# Patient Record
Sex: Female | Born: 1985
Health system: Southern US, Community
[De-identification: ages and names within clinical notes are randomized; demographics above are authoritative.]

## PROBLEM LIST (undated history)

## (undated) DIAGNOSIS — B9689 Other specified bacterial agents as the cause of diseases classified elsewhere: Secondary | ICD-10-CM

## (undated) DIAGNOSIS — N760 Acute vaginitis: Secondary | ICD-10-CM

## (undated) DIAGNOSIS — I10 Essential (primary) hypertension: Secondary | ICD-10-CM

## (undated) DIAGNOSIS — N390 Urinary tract infection, site not specified: Secondary | ICD-10-CM

## (undated) HISTORY — PX: APPENDECTOMY: SHX54

## (undated) HISTORY — PX: TUBAL LIGATION: SHX77

## (undated) HISTORY — PX: BREAST CYST EXCISION: SHX579

---

## 2012-11-15 ENCOUNTER — Encounter (HOSPITAL_BASED_OUTPATIENT_CLINIC_OR_DEPARTMENT_OTHER): Payer: Self-pay | Admitting: *Deleted

## 2012-11-15 ENCOUNTER — Emergency Department (HOSPITAL_BASED_OUTPATIENT_CLINIC_OR_DEPARTMENT_OTHER): Payer: Medicaid Other

## 2012-11-15 ENCOUNTER — Emergency Department (HOSPITAL_BASED_OUTPATIENT_CLINIC_OR_DEPARTMENT_OTHER)
Admission: EM | Admit: 2012-11-15 | Discharge: 2012-11-15 | Disposition: A | Discharge status: Home / Self Care | Payer: Medicaid Other | Attending: Emergency Medicine | Admitting: Emergency Medicine

## 2012-11-15 DIAGNOSIS — J40 Bronchitis, not specified as acute or chronic: Secondary | ICD-10-CM

## 2012-11-15 DIAGNOSIS — Z79899 Other long term (current) drug therapy: Secondary | ICD-10-CM | POA: Insufficient documentation

## 2012-11-15 DIAGNOSIS — R52 Pain, unspecified: Secondary | ICD-10-CM | POA: Insufficient documentation

## 2012-11-15 DIAGNOSIS — Z791 Long term (current) use of non-steroidal anti-inflammatories (NSAID): Secondary | ICD-10-CM | POA: Insufficient documentation

## 2012-11-15 DIAGNOSIS — J3489 Other specified disorders of nose and nasal sinuses: Secondary | ICD-10-CM | POA: Insufficient documentation

## 2012-11-15 DIAGNOSIS — IMO0001 Reserved for inherently not codable concepts without codable children: Secondary | ICD-10-CM | POA: Insufficient documentation

## 2012-11-15 MED ORDER — NAPROXEN 500 MG PO TABS: 500.0000 mg | ORAL_TABLET | Freq: Two times a day (BID) | ORAL | Status: DC

## 2012-11-15 MED ORDER — MUCINEX DM 30-600 MG PO TB12: 1.0000 | ORAL_TABLET | Freq: Two times a day (BID) | ORAL | Status: DC

## 2012-11-15 MED ORDER — ALBUTEROL SULFATE HFA 108 (90 BASE) MCG/ACT IN AERS: 1.0000 | INHALATION_SPRAY | Freq: Four times a day (QID) | RESPIRATORY_TRACT | Status: DC | PRN

## 2012-11-15 MED ORDER — ALBUTEROL SULFATE HFA 108 (90 BASE) MCG/ACT IN AERS
2.0000 | INHALATION_SPRAY | RESPIRATORY_TRACT | Status: DC | PRN
  Administered 2012-11-15: 2 via RESPIRATORY_TRACT
  Filled 2012-11-15: qty 6.7

## 2012-11-15 NOTE — ED Notes (Signed)
Patient was instructed on proper MDI use with spacer. Patient demonstrated technique well and had no issues nor questions. Rt will continue to monitor.

## 2012-11-15 NOTE — ED Notes (Signed)
Patient transported to and from x-ray.

## 2012-11-15 NOTE — ED Provider Notes (Signed)
History     CSN: [id number]  Arrival date & time 11/15/12  0744   First MD Initiated Contact with Patient 11/15/12 [phone]      Chief Complaint  Patient presents with  . Cough  . Generalized Body Aches    (Consider location/radiation/quality/duration/timing/severity/associated sxs/prior treatment) Patient is a 27 y.o. female presenting with cough. The history is provided by the patient.  Cough Associated symptoms: myalgias   Associated symptoms: no chest pain, no fever, no headaches, no rash, no shortness of breath and no sore throat    one-week history of cough congestion and bodyaches. Family members had the same illness. Now predominantly with productive cough with yellow sputum and occasional blood streaking. Early in the illness has some nausea and vomiting but that has resolved. Patient denies fevers.  History reviewed. No pertinent past medical history.  History reviewed. No pertinent past surgical history.  History reviewed. No pertinent family history.  History  Substance Use Topics  . Smoking status: Never Smoker   . Smokeless tobacco: Not on file  . Alcohol Use: Yes     Comment: occ    OB History   Grav Para Term Preterm Abortions TAB SAB Ect Mult Living                  Review of Systems  Constitutional: Negative for fever.  HENT: Positive for congestion. Negative for sore throat and sinus pressure.   Eyes: Negative for redness.  Respiratory: Positive for cough. Negative for shortness of breath.   Cardiovascular: Negative for chest pain.  Gastrointestinal: Negative for nausea, vomiting and abdominal pain.  Genitourinary: Negative for dysuria.  Musculoskeletal: Positive for myalgias.  Skin: Negative for rash.  Neurological: Negative for headaches.  Hematological: Does not bruise/bleed easily.    Allergies  Review of patient's allergies indicates no known allergies.  Home Medications   Current Outpatient Rx  Name  Route  Sig  Dispense  Refill  .  albuterol (PROVENTIL HFA;VENTOLIN HFA) 108 (90 BASE) MCG/ACT inhaler   Inhalation   Inhale 1-2 puffs into the lungs every 6 (six) hours as needed for wheezing.   1 Inhaler   0   . Dextromethorphan-Guaifenesin (MUCINEX DM) 30-600 MG TB12   Oral   Take 1 tablet by mouth every 12 (twelve) hours.   14 each   0   . naproxen (NAPROSYN) 500 MG tablet   Oral   Take 1 tablet (500 mg total) by mouth 2 (two) times daily.   14 tablet   0     BP 129/92  Pulse 92  Temp(Src) 98.2 F (36.8 C) (Oral)  Resp 18  SpO2 100%  LMP 11/04/2012  Physical Exam  Nursing note and vitals reviewed. Constitutional: She is oriented to person, place, and time. She appears well-developed and well-nourished. No distress.  HENT:  Head: Normocephalic and atraumatic.  Mouth/Throat: Oropharynx is clear and moist.  Eyes: Conjunctivae and EOM are normal. Pupils are equal, round, and reactive to light.  Neck: Normal range of motion. Neck supple.  Cardiovascular: Normal rate, regular rhythm and normal heart sounds.   No murmur heard. Pulmonary/Chest: Breath sounds normal. No respiratory distress. She has no wheezes. She has no rales.  Abdominal: Soft. Bowel sounds are normal. There is no tenderness.  Musculoskeletal: Normal range of motion.  Neurological: She is alert and oriented to person, place, and time. No cranial nerve deficit. She exhibits normal muscle tone. Coordination normal.  Skin: Skin is warm. No rash noted.  ED Course  Procedures (including critical care time)  Labs Reviewed - No data to display Dg Chest 2 View  11/15/2012  *RADIOLOGY REPORT*  Clinical Data: Cough and body aches.  CHEST - 2 VIEW  Comparison: None  Findings: The cardiac silhouette, mediastinal and hilar contours are normal.  The lungs are clear.  No pleural effusion.  The bony thorax is intact.  IMPRESSION: Normal chest x-ray.   Original Report Authenticated By: Rudie Meyer, M.D.      1. Bronchitis       MDM  Patient  nontoxic no acute distress. Chest x-ray negative for pneumonia symptoms consistent with a viral illness upper respiratory illness now predominantly a viral bronchitis. We'll treat her with albuterol inhaler and Mucinex DM and Naprosyn.        Shelda Jakes, MD 11/15/12 [phone]

## 2012-11-15 NOTE — ED Notes (Signed)
Pt states she has been sick for one week with cough congestion and body aches ..daughter had same first  Then mom caught it . Productive cough with yellow sputum

## 2014-06-18 ENCOUNTER — Encounter (HOSPITAL_BASED_OUTPATIENT_CLINIC_OR_DEPARTMENT_OTHER): Payer: Self-pay | Admitting: Emergency Medicine

## 2014-06-18 ENCOUNTER — Emergency Department (HOSPITAL_BASED_OUTPATIENT_CLINIC_OR_DEPARTMENT_OTHER)
Admission: EM | Admit: 2014-06-18 | Discharge: 2014-06-18 | Disposition: A | Discharge status: Home / Self Care | Payer: Medicaid Other | Attending: Emergency Medicine | Admitting: Emergency Medicine

## 2014-06-18 DIAGNOSIS — Z79899 Other long term (current) drug therapy: Secondary | ICD-10-CM | POA: Insufficient documentation

## 2014-06-18 DIAGNOSIS — J069 Acute upper respiratory infection, unspecified: Secondary | ICD-10-CM | POA: Diagnosis not present

## 2014-06-18 DIAGNOSIS — Z791 Long term (current) use of non-steroidal anti-inflammatories (NSAID): Secondary | ICD-10-CM | POA: Insufficient documentation

## 2014-06-18 DIAGNOSIS — J029 Acute pharyngitis, unspecified: Secondary | ICD-10-CM | POA: Diagnosis present

## 2014-06-18 DIAGNOSIS — E669 Obesity, unspecified: Secondary | ICD-10-CM | POA: Diagnosis not present

## 2014-06-18 NOTE — ED Provider Notes (Signed)
CSN: [id number]     Arrival date & time 06/18/14  1250 History   First MD Initiated Contact with Patient 06/18/14 1335     Chief Complaint  Patient presents with  . Sore Throat     (Consider location/radiation/quality/duration/timing/severity/associated sxs/prior Treatment) HPI 28 y.o female with nasal congestion, sneezing, body aches after caring for daughter with similar symptoms last week.  Patient with symptoms present for two days.  She denies fever, chills, dyspnea, productive cough, nausea, vomiting or diarrhea.  STates took benadryl and mucous membranes dry.    History reviewed. No pertinent past medical history. Past Surgical History  Procedure Laterality Date  . Appendectomy    . Breast cyst excision    . Tubal ligation     No family history on file. History  Substance Use Topics  . Smoking status: Never Smoker   . Smokeless tobacco: Not on file  . Alcohol Use: Yes     Comment: occ   OB History   Grav Para Term Preterm Abortions TAB SAB Ect Mult Living                 Review of Systems  All other systems reviewed and are negative.     Allergies  Review of patient's allergies indicates no known allergies.  Home Medications   Prior to Admission medications   Medication Sig Start Date End Date Taking? Authorizing Provider  Cetirizine HCl (ZYRTEC ALLERGY PO) Take by mouth.   Yes Historical Provider, MD  DiphenhydrAMINE HCl (BENADRYL ALLERGY PO) Take by mouth.   Yes Historical Provider, MD  albuterol (PROVENTIL HFA;VENTOLIN HFA) 108 (90 BASE) MCG/ACT inhaler Inhale 1-2 puffs into the lungs every 6 (six) hours as needed for wheezing. 11/15/12   Vanetta Mulders, MD  Dextromethorphan-Guaifenesin (MUCINEX DM) 30-600 MG TB12 Take 1 tablet by mouth every 12 (twelve) hours. 11/15/12   Vanetta Mulders, MD  naproxen (NAPROSYN) 500 MG tablet Take 1 tablet (500 mg total) by mouth 2 (two) times daily. 11/15/12   Vanetta Mulders, MD   BP 137/88  Pulse 90  Temp(Src) 98.4 F  (36.9 C) (Oral)  Resp 16  Ht  (1.575 m)  Wt 230 lb (104.327 kg)  BMI 42.06 kg/m2  SpO2 100%  LMP 06/13/2014 Physical Exam  Nursing note and vitals reviewed. Constitutional: She is oriented to person, place, and time. She appears well-developed and well-nourished.  Obese  HENT:  Head: Normocephalic and atraumatic.  Right Ear: External ear normal.  Left Ear: External ear normal.  Nose: Nose normal.  Mouth/Throat: Oropharynx is clear and moist.  Eyes: Conjunctivae and EOM are normal. Pupils are equal, round, and reactive to light.  Neck: Normal range of motion. Neck supple. No JVD present. No tracheal deviation present. No thyromegaly present.  Cardiovascular: Normal rate, regular rhythm, normal heart sounds and intact distal pulses.   Pulmonary/Chest: Effort normal and breath sounds normal. No respiratory distress. She has no wheezes.  Abdominal: Soft. Bowel sounds are normal. She exhibits no mass. There is no tenderness. There is no guarding.  Musculoskeletal: Normal range of motion.  Lymphadenopathy:    She has no cervical adenopathy.  Neurological: She is alert and oriented to person, place, and time. She has normal reflexes. No cranial nerve deficit or sensory deficit. Gait normal. GCS eye subscore is 4. GCS verbal subscore is 5. GCS motor subscore is 6.  Reflex Scores:      Bicep reflexes are 2+ on the right side and 2+ on the left  side.      Patellar reflexes are 2+ on the right side and 2+ on the left side. Strength is 5/5 bilateral elbow flexor/extensors, wrist extension/flexion, intrinsic hand strength equal Bilateral hip flexion/extension 5/5, knee flexion/extension 5/5, ankle 5/5 flexion extension    Skin: Skin is warm and dry.  Psychiatric: She has a normal mood and affect. Her behavior is normal. Judgment and thought content normal.    ED Course  Procedures (including critical care time) Labs Review Labs Reviewed - No data to display  Imaging Review No  results found.   EKG Interpretation None      MDM   Final diagnoses:  URI (upper respiratory infection)     Patients symptoms are consistent with URI, likely viral etiology. Discussed that antibiotics are not indicated for viral infections. Pt will be discharged with symptomatic treatment.  Verbalizes understanding and is agreeable with plan. Pt is hemodynamically stable & in NAD prior to dc.     Hilario Quarry, MD 06/18/14 [phone]

## 2014-06-18 NOTE — Discharge Instructions (Signed)
Upper Respiratory Infection, Adult An upper respiratory infection (URI) is also sometimes known as the common cold. The upper respiratory tract includes the nose, sinuses, throat, trachea, and bronchi. Bronchi are the airways leading to the lungs. Most people improve within 1 week, but symptoms can last up to 2 weeks. A residual cough may last even longer.  CAUSES Many different viruses can infect the tissues lining the upper respiratory tract. The tissues become irritated and inflamed and often become very moist. Mucus production is also common. A cold is contagious. You can easily spread the virus to others by oral contact. This includes kissing, sharing a glass, coughing, or sneezing. Touching your mouth or nose and then touching a surface, which is then touched by another person, can also spread the virus. SYMPTOMS  Symptoms typically develop 1 to 3 days after you come in contact with a cold virus. Symptoms vary from person to person. They may include:  Runny nose.  Sneezing.  Nasal congestion.  Sinus irritation.  Sore throat.  Loss of voice (laryngitis).  Cough.  Fatigue.  Muscle aches.  Loss of appetite.  Headache.  Low-grade fever. DIAGNOSIS  You might diagnose your own cold based on familiar symptoms, since most people get a cold 2 to 3 times a year. Your caregiver can confirm this based on your exam. Most importantly, your caregiver can check that your symptoms are not due to another disease such as strep throat, sinusitis, pneumonia, asthma, or epiglottitis. Blood tests, throat tests, and X-rays are not necessary to diagnose a common cold, but they may sometimes be helpful in excluding other more serious diseases. Your caregiver will decide if any further tests are required. RISKS AND COMPLICATIONS  You may be at risk for a more severe case of the common cold if you smoke cigarettes, have chronic heart disease (such as heart failure) or lung disease (such as asthma), or if  you have a weakened immune system. The very young and very old are also at risk for more serious infections. Bacterial sinusitis, middle ear infections, and bacterial pneumonia can complicate the common cold. The common cold can worsen asthma and chronic obstructive pulmonary disease (COPD). Sometimes, these complications can require emergency medical care and may be life-threatening. PREVENTION  The best way to protect against getting a cold is to practice good hygiene. Avoid oral or hand contact with people with cold symptoms. Wash your hands often if contact occurs. There is no clear evidence that vitamin C, vitamin E, echinacea, or exercise reduces the chance of developing a cold. However, it is always recommended to get plenty of rest and practice good nutrition. TREATMENT  Treatment is directed at relieving symptoms. There is no cure. Antibiotics are not effective, because the infection is caused by a virus, not by bacteria. Treatment may include:  Increased fluid intake. Sports drinks offer valuable electrolytes, sugars, and fluids.  Breathing heated mist or steam (vaporizer or shower).  Eating chicken soup or other clear broths, and maintaining good nutrition.  Getting plenty of rest.  Using gargles or lozenges for comfort.  Controlling fevers with ibuprofen or acetaminophen as directed by your caregiver.  Increasing usage of your inhaler if you have asthma. Zinc gel and zinc lozenges, taken in the first 24 hours of the common cold, can shorten the duration and lessen the severity of symptoms. Pain medicines may help with fever, muscle aches, and throat pain. A variety of non-prescription medicines are available to treat congestion and runny nose. Your caregiver   can make recommendations and may suggest nasal or lung inhalers for other symptoms.  HOME CARE INSTRUCTIONS   Only take over-the-counter or prescription medicines for pain, discomfort, or fever as directed by your  caregiver.  Use a warm mist humidifier or inhale steam from a shower to increase air moisture. This may keep secretions moist and make it easier to breathe.  Drink enough water and fluids to keep your urine clear or pale yellow.  Rest as needed.  Return to work when your temperature has returned to normal or as your caregiver advises. You may need to stay home longer to avoid infecting others. You can also use a face mask and careful hand washing to prevent spread of the virus. SEEK MEDICAL CARE IF:   After the first few days, you feel you are getting worse rather than better.  You need your caregiver's advice about medicines to control symptoms.  You develop chills, worsening shortness of breath, or brown or red sputum. These may be signs of pneumonia.  You develop yellow or brown nasal discharge or pain in the face, especially when you bend forward. These may be signs of sinusitis.  You develop a fever, swollen neck glands, pain with swallowing, or white areas in the back of your throat. These may be signs of strep throat. SEEK IMMEDIATE MEDICAL CARE IF:   You have a fever.  You develop severe or persistent headache, ear pain, sinus pain, or chest pain.  You develop wheezing, a prolonged cough, cough up blood, or have a change in your usual mucus (if you have chronic lung disease).  You develop sore muscles or a stiff neck. Document Released: 03/10/2001 Document Revised: 12/07/2011 Document Reviewed: 12/20/2013 [employer] Patient Information 2015 [employer]. This information is not intended to replace advice given to you by your health care provider. Make sure you discuss any questions you have with your health care provider.  

## 2014-06-18 NOTE — ED Notes (Signed)
C/o sore throat, cough, generalized pain x 2 days

## 2015-12-22 ENCOUNTER — Encounter (HOSPITAL_BASED_OUTPATIENT_CLINIC_OR_DEPARTMENT_OTHER): Payer: Self-pay | Admitting: Emergency Medicine

## 2015-12-22 ENCOUNTER — Emergency Department (HOSPITAL_BASED_OUTPATIENT_CLINIC_OR_DEPARTMENT_OTHER)
Admission: EM | Admit: 2015-12-22 | Discharge: 2015-12-22 | Disposition: A | Discharge status: Home / Self Care | Payer: No Typology Code available for payment source | Attending: Emergency Medicine | Admitting: Emergency Medicine

## 2015-12-22 DIAGNOSIS — M545 Low back pain, unspecified: Secondary | ICD-10-CM

## 2015-12-22 DIAGNOSIS — S299XXA Unspecified injury of thorax, initial encounter: Secondary | ICD-10-CM | POA: Insufficient documentation

## 2015-12-22 DIAGNOSIS — Z79899 Other long term (current) drug therapy: Secondary | ICD-10-CM | POA: Insufficient documentation

## 2015-12-22 DIAGNOSIS — Y9389 Activity, other specified: Secondary | ICD-10-CM | POA: Insufficient documentation

## 2015-12-22 DIAGNOSIS — Z791 Long term (current) use of non-steroidal anti-inflammatories (NSAID): Secondary | ICD-10-CM | POA: Insufficient documentation

## 2015-12-22 DIAGNOSIS — Y998 Other external cause status: Secondary | ICD-10-CM | POA: Diagnosis not present

## 2015-12-22 DIAGNOSIS — Y92410 Unspecified street and highway as the place of occurrence of the external cause: Secondary | ICD-10-CM | POA: Insufficient documentation

## 2015-12-22 DIAGNOSIS — S3992XA Unspecified injury of lower back, initial encounter: Secondary | ICD-10-CM | POA: Diagnosis not present

## 2015-12-22 MED ORDER — CYCLOBENZAPRINE HCL 10 MG PO TABS: 10.0000 mg | ORAL_TABLET | Freq: Two times a day (BID) | ORAL | Status: DC | PRN

## 2015-12-22 NOTE — ED Notes (Signed)
Pt c/o neck pain and radiating back pain,
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
## 2015-12-22 NOTE — Discharge Instructions (Signed)
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
## 2015-12-28 ENCOUNTER — Emergency Department (HOSPITAL_BASED_OUTPATIENT_CLINIC_OR_DEPARTMENT_OTHER)
Admission: EM | Admit: 2015-12-28 | Discharge: 2015-12-28 | Disposition: A | Discharge status: Home / Self Care | Payer: Medicaid Other | Attending: Emergency Medicine | Admitting: Emergency Medicine

## 2015-12-28 ENCOUNTER — Encounter (HOSPITAL_BASED_OUTPATIENT_CLINIC_OR_DEPARTMENT_OTHER): Payer: Self-pay | Admitting: *Deleted

## 2015-12-28 DIAGNOSIS — Z791 Long term (current) use of non-steroidal anti-inflammatories (NSAID): Secondary | ICD-10-CM | POA: Diagnosis not present

## 2015-12-28 DIAGNOSIS — Z202 Contact with and (suspected) exposure to infections with a predominantly sexual mode of transmission: Secondary | ICD-10-CM | POA: Diagnosis not present

## 2015-12-28 DIAGNOSIS — Z79899 Other long term (current) drug therapy: Secondary | ICD-10-CM | POA: Diagnosis not present

## 2015-12-28 DIAGNOSIS — Z3202 Encounter for pregnancy test, result negative: Secondary | ICD-10-CM | POA: Diagnosis not present

## 2015-12-28 LAB — WET PREP, GENITAL
CLUE CELLS WET PREP: NONE SEEN
Sperm: NONE SEEN
TRICH WET PREP: NONE SEEN
YEAST WET PREP: NONE SEEN

## 2015-12-28 LAB — PREGNANCY, URINE: Preg Test, Ur: NEGATIVE

## 2015-12-28 MED ORDER — CEFTRIAXONE SODIUM 250 MG IJ SOLR
250.0000 mg | Freq: Once | INTRAMUSCULAR | Status: AC
  Administered 2015-12-28: 250 mg via INTRAMUSCULAR
  Filled 2015-12-28: qty 250

## 2015-12-28 MED ORDER — LIDOCAINE HCL (PF) 1 % IJ SOLN
INTRAMUSCULAR | Status: AC
  Administered 2015-12-28: 2.1 mL
  Filled 2015-12-28: qty 5

## 2015-12-28 MED ORDER — AZITHROMYCIN 250 MG PO TABS
1000.0000 mg | ORAL_TABLET | Freq: Once | ORAL | Status: AC
  Administered 2015-12-28: 1000 mg via ORAL
  Filled 2015-12-28: qty 4
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
## 2015-12-30 LAB — GC/CHLAMYDIA PROBE AMP (~~LOC~~) NOT AT ARMC
Chlamydia: NEGATIVE
Neisseria Gonorrhea: NEGATIVE

## 2016-02-04 ENCOUNTER — Encounter (HOSPITAL_BASED_OUTPATIENT_CLINIC_OR_DEPARTMENT_OTHER): Payer: Self-pay | Admitting: *Deleted

## 2016-02-04 ENCOUNTER — Emergency Department (HOSPITAL_BASED_OUTPATIENT_CLINIC_OR_DEPARTMENT_OTHER): Payer: Medicaid Other

## 2016-02-04 ENCOUNTER — Emergency Department (HOSPITAL_BASED_OUTPATIENT_CLINIC_OR_DEPARTMENT_OTHER)
Admission: EM | Admit: 2016-02-04 | Discharge: 2016-02-04 | Disposition: A | Discharge status: Home / Self Care | Payer: Medicaid Other | Attending: Emergency Medicine | Admitting: Emergency Medicine

## 2016-02-04 DIAGNOSIS — I10 Essential (primary) hypertension: Secondary | ICD-10-CM | POA: Insufficient documentation

## 2016-02-04 DIAGNOSIS — J329 Chronic sinusitis, unspecified: Secondary | ICD-10-CM | POA: Insufficient documentation

## 2016-02-04 DIAGNOSIS — R0602 Shortness of breath: Secondary | ICD-10-CM | POA: Diagnosis present

## 2016-02-04 DIAGNOSIS — J3489 Other specified disorders of nose and nasal sinuses: Secondary | ICD-10-CM

## 2016-02-04 DIAGNOSIS — M542 Cervicalgia: Secondary | ICD-10-CM | POA: Insufficient documentation

## 2016-02-04 DIAGNOSIS — R0981 Nasal congestion: Secondary | ICD-10-CM

## 2016-02-04 HISTORY — DX: Essential (primary) hypertension: I10

## 2016-02-04 MED ORDER — ALBUTEROL SULFATE HFA 108 (90 BASE) MCG/ACT IN AERS
1.0000 | INHALATION_SPRAY | Freq: Once | RESPIRATORY_TRACT | Status: AC
  Administered 2016-02-04: 2 via RESPIRATORY_TRACT
  Filled 2016-02-04: qty 6.7

## 2016-02-04 MED ORDER — MUCINEX DM 30-600 MG PO TB12: 1.0000 | ORAL_TABLET | Freq: Two times a day (BID) | ORAL | Status: DC

## 2016-02-04 MED ORDER — IBUPROFEN 800 MG PO TABS
800.0000 mg | ORAL_TABLET | Freq: Once | ORAL | Status: AC
  Administered 2016-02-04: 800 mg via ORAL
  Filled 2016-02-04: qty 1

## 2016-02-04 MED ORDER — CETIRIZINE-PSEUDOEPHEDRINE ER 5-120 MG PO TB12: 1.0000 | ORAL_TABLET | Freq: Two times a day (BID) | ORAL | Status: DC

## 2016-02-04 MED FILL — SM ALL DAY ALLERGY-D TABLET: 5-120 | 15 days supply | Qty: 30 | Fill #0

## 2016-02-04 NOTE — ED Notes (Signed)
PA at bedside.

## 2016-02-04 NOTE — Discharge Instructions (Signed)
Medications: Zyrtec-D, Albuterol, Mucinex DM  Treatment: Take Zyrtec-D twice daily as prescribed. Use albuterol inhaler as needed for shortness of breath. Take Mucinex DM twice daily for cough, if you develop one. You may take ibuprofen or Tylenol every 4-6 hours as needed for your neck soreness, headaches, and facial pressure.  Follow-up: Please follow-up with your primary care provider if your symptoms have not improved in 7-10 days. Please return to the emergency department if he develop any worsening shortness of breath, chest pain, or any other concerning symptoms.   Upper Respiratory Infection, Adult Most upper respiratory infections (URIs) are a viral infection of the air passages leading to the lungs. A URI affects the nose, throat, and upper air passages. The most common type of URI is nasopharyngitis and is typically referred to as "the common cold." URIs run their course and usually go away on their own. Most of the time, a URI does not require medical attention, but sometimes a bacterial infection in the upper airways can follow a viral infection. This is called a secondary infection. Sinus and middle ear infections are common types of secondary upper respiratory infections. Bacterial pneumonia can also complicate a URI. A URI can worsen asthma and chronic obstructive pulmonary disease (COPD). Sometimes, these complications can require emergency medical care and may be life threatening.  CAUSES Almost all URIs are caused by viruses. A virus is a type of germ and can spread from one person to another.  RISKS FACTORS You may be at risk for a URI if:   You smoke.   You have chronic heart or lung disease.  You have a weakened defense (immune) system.   You are very young or very old.   You have nasal allergies or asthma.  You work in crowded or poorly ventilated areas.  You work in health care facilities or schools. SIGNS AND SYMPTOMS  Symptoms typically develop 2-3 days  after you come in contact with a cold virus. Most viral URIs last 7-10 days. However, viral URIs from the influenza virus (flu virus) can last 14-18 days and are typically more severe. Symptoms may include:   Runny or stuffy (congested) nose.   Sneezing.   Cough.   Sore throat.   Headache.   Fatigue.   Fever.   Loss of appetite.   Pain in your forehead, behind your eyes, and over your cheekbones (sinus pain).  Muscle aches.  DIAGNOSIS  Your health care provider may diagnose a URI by:  Physical exam.  Tests to check that your symptoms are not due to another condition such as:  Strep throat.  Sinusitis.  Pneumonia.  Asthma. TREATMENT  A URI goes away on its own with time. It cannot be cured with medicines, but medicines may be prescribed or recommended to relieve symptoms. Medicines may help:  Reduce your fever.  Reduce your cough.  Relieve nasal congestion. HOME CARE INSTRUCTIONS   Take medicines only as directed by your health care provider.   Gargle warm saltwater or take cough drops to comfort your throat as directed by your health care provider.  Use a warm mist humidifier or inhale steam from a shower to increase air moisture. This may make it easier to breathe.  Drink enough fluid to keep your urine clear or pale yellow.   Eat soups and other clear broths and maintain good nutrition.   Rest as needed.   Return to work when your temperature has returned to normal or as your health care provider  advises. You may need to stay home longer to avoid infecting others. You can also use a face mask and careful hand washing to prevent spread of the virus.  Increase the usage of your inhaler if you have asthma.   Do not use any tobacco products, including cigarettes, chewing tobacco, or electronic cigarettes. If you need help quitting, ask your health care provider. PREVENTION  The best way to protect yourself from getting a cold is to practice  good hygiene.   Avoid oral or hand contact with people with cold symptoms.   Wash your hands often if contact occurs.  There is no clear evidence that vitamin C, vitamin E, echinacea, or exercise reduces the chance of developing a cold. However, it is always recommended to get plenty of rest, exercise, and practice good nutrition.  SEEK MEDICAL CARE IF:   You are getting worse rather than better.   Your symptoms are not controlled by medicine.   You have chills.  You have worsening shortness of breath.  You have brown or red mucus.  You have yellow or brown nasal discharge.  You have pain in your face, especially when you bend forward.  You have a fever.  You have swollen neck glands.  You have pain while swallowing.  You have white areas in the back of your throat. SEEK IMMEDIATE MEDICAL CARE IF:   You have severe or persistent:  Headache.  Ear pain.  Sinus pain.  Chest pain.  You have chronic lung disease and any of the following:  Wheezing.  Prolonged cough.  Coughing up blood.  A change in your usual mucus.  You have a stiff neck.  You have changes in your:  Vision.  Hearing.  Thinking.  Mood. MAKE SURE YOU:   Understand these instructions.  Will watch your condition.  Will get help right away if you are not doing well or get worse.   This information is not intended to replace advice given to you by your health care provider. Make sure you discuss any questions you have with your health care provider.   Document Released: 03/10/2001 Document Revised: 01/29/2015 Document Reviewed: 12/20/2013 Elsevier Interactive Patient Education 2016 [employer]  Musculoskeletal Pain Musculoskeletal pain is muscle and boney aches and pains. These pains can occur in any part of the body. Your caregiver may treat you without knowing the cause of the pain. They may treat you if blood or urine tests, X-rays, and other tests were normal.   CAUSES There is often not a definite cause or reason for these pains. These pains may be caused by a type of germ (virus). The discomfort may also come from overuse. Overuse includes working out too hard when your body is not fit. Boney aches also come from weather changes. Bone is sensitive to atmospheric pressure changes. HOME CARE INSTRUCTIONS   Ask when your test results will be ready. Make sure you get your test results.  Only take over-the-counter or prescription medicines for pain, discomfort, or fever as directed by your caregiver. If you were given medications for your condition, do not drive, operate machinery or power tools, or sign legal documents for 24 hours. Do not drink alcohol. Do not take sleeping pills or other medications that may interfere with treatment.  Continue all activities unless the activities cause more pain. When the pain lessens, slowly resume normal activities. Gradually increase the intensity and duration of the activities or exercise.  During periods of severe pain, bed rest may  be helpful. Lay or sit in any position that is comfortable.  Putting ice on the injured area.  Put ice in a bag.  Place a towel between your skin and the bag.  Leave the ice on for 15 to 20 minutes, 3 to 4 times a day.  Follow up with your caregiver for continued problems and no reason can be found for the pain. If the pain becomes worse or does not go away, it may be necessary to repeat tests or do additional testing. Your caregiver may need to look further for a possible cause. SEEK IMMEDIATE MEDICAL CARE IF:  You have pain that is getting worse and is not relieved by medications.  You develop chest pain that is associated with shortness or breath, sweating, feeling sick to your stomach (nauseous), or throw up (vomit).  Your pain becomes localized to the abdomen.  You develop any new symptoms that seem different or that concern you. MAKE SURE YOU:   Understand these  instructions.  Will watch your condition.  Will get help right away if you are not doing well or get worse.   This information is not intended to replace advice given to you by your health care provider. Make sure you discuss any questions you have with your health care provider.   Document Released: 09/14/2005 Document Revised: 12/07/2011 Document Reviewed: 05/19/2013 Elsevier Interactive Patient Education [employer]

## 2016-02-04 NOTE — ED Notes (Signed)
C/o congestion, left ear pain, some dizziness, facial pressure, pain in back of neck since Sunday. States b/p was elevated on Friday but she has been taking her medicine now and it has improved. 160/88 in triage

## 2016-02-04 NOTE — ED Notes (Signed)
Patient transported to X-ray 
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
## 2017-01-30 ENCOUNTER — Emergency Department (HOSPITAL_BASED_OUTPATIENT_CLINIC_OR_DEPARTMENT_OTHER)
Admission: EM | Admit: 2017-01-30 | Discharge: 2017-01-30 | Disposition: A | Discharge status: Home / Self Care | Payer: Self-pay | Attending: Emergency Medicine | Admitting: Emergency Medicine

## 2017-01-30 ENCOUNTER — Encounter (HOSPITAL_BASED_OUTPATIENT_CLINIC_OR_DEPARTMENT_OTHER): Payer: Self-pay | Admitting: *Deleted

## 2017-01-30 ENCOUNTER — Emergency Department (HOSPITAL_BASED_OUTPATIENT_CLINIC_OR_DEPARTMENT_OTHER): Payer: Self-pay

## 2017-01-30 DIAGNOSIS — J302 Other seasonal allergic rhinitis: Secondary | ICD-10-CM | POA: Insufficient documentation

## 2017-01-30 DIAGNOSIS — I10 Essential (primary) hypertension: Secondary | ICD-10-CM | POA: Insufficient documentation

## 2017-01-30 DIAGNOSIS — Z79899 Other long term (current) drug therapy: Secondary | ICD-10-CM | POA: Insufficient documentation

## 2017-01-30 DIAGNOSIS — R51 Headache: Secondary | ICD-10-CM

## 2017-01-30 DIAGNOSIS — R519 Headache, unspecified: Secondary | ICD-10-CM

## 2017-01-30 DIAGNOSIS — R0789 Other chest pain: Secondary | ICD-10-CM | POA: Insufficient documentation

## 2017-01-30 DIAGNOSIS — R0602 Shortness of breath: Secondary | ICD-10-CM | POA: Insufficient documentation

## 2017-01-30 MED ORDER — LORATADINE 10 MG PO TABS: 10.0000 mg | ORAL_TABLET | Freq: Every day | ORAL | 0 refills | Status: DC

## 2017-01-30 MED ORDER — OXYMETAZOLINE HCL 0.05 % NA SOLN
2.0000 | Freq: Once | NASAL | Status: AC
  Administered 2017-01-30: 2 via NASAL
  Filled 2017-01-30: qty 15

## 2017-01-30 MED ORDER — KETOROLAC TROMETHAMINE 60 MG/2ML IM SOLN
60.0000 mg | Freq: Once | INTRAMUSCULAR | Status: AC
  Administered 2017-01-30: 60 mg via INTRAMUSCULAR
  Filled 2017-01-30: qty 2

## 2017-01-30 MED ORDER — FLUTICASONE PROPIONATE 50 MCG/ACT NA SUSP: 2.0000 | Freq: Every day | NASAL | 0 refills | Status: DC

## 2017-01-30 MED ORDER — IBUPROFEN 600 MG PO TABS: 600.0000 mg | ORAL_TABLET | Freq: Four times a day (QID) | ORAL | 0 refills | Status: DC | PRN

## 2017-01-30 NOTE — ED Triage Notes (Addendum)
Pt reports waking up today around 0500 with a headache to L temple. Also reports feeling anxious and sob (reports it's improved since then -- pt able to talk in complete sentences). Denies fever, n/v/d, hx of migraines. States she usually gets headaches when her BP is high (reports running out of HTN med 3 days ago).

## 2017-01-30 NOTE — ED Notes (Signed)
ED Provider at bedside. 

## 2017-01-30 NOTE — ED Notes (Signed)
Patient transported to X-ray 

## 2017-01-30 NOTE — ED Provider Notes (Signed)
[facility] Provider Note   CSN: [id number] Arrival date & time: 01/30/17  0702     History   Chief Complaint Chief Complaint  Patient presents with  . Headache    HPI Renee Moore is a 31 y.o. female.  HPI Patient presents with left facial headache that was present this morning when waking. Admits to nasal congestion. Denies photophobia, nausea or vomiting. No fever or chills. Denies any known injury. Patient has also had some chest tightness associated with shortness of breath since resolved. She has history of allergies and thinks she may be getting a sinus infection. Past Medical History:  Diagnosis Date  . Hypertension     There are no active problems to display for this patient.   Past Surgical History:  Procedure Laterality Date  . APPENDECTOMY    . BREAST CYST EXCISION    . TUBAL LIGATION      OB History    No data available       Home Medications    Prior to Admission medications   Medication Sig Start Date End Date Taking? Authorizing Provider  UNKNOWN TO PATIENT    Yes [provider]  albuterol (PROVENTIL HFA;VENTOLIN HFA) 108 (90 BASE) MCG/ACT inhaler Inhale 1-2 puffs into the lungs every 6 (six) hours as needed for wheezing. 11/15/12   Vanetta Mulders, MD  Cetirizine HCl (ZYRTEC ALLERGY PO) Take by mouth.    [provider]  cetirizine-pseudoephedrine (ZYRTEC-D) 5-120 MG tablet Take 1 tablet by mouth 2 (two) times daily. 02/04/16   Law, Waylan Boga, PA-C  cyclobenzaprine (FLEXERIL) 10 MG tablet Take 1 tablet (10 mg total) by mouth 2 (two) times daily as needed for muscle spasms. 12/22/15   Hedges, Tinnie Gens, PA-C  Dextromethorphan-Guaifenesin (MUCINEX DM) 30-600 MG TB12 Take 1 tablet by mouth every 12 (twelve) hours. 02/04/16   Law, Waylan Boga, PA-C  DiphenhydrAMINE HCl (BENADRYL ALLERGY PO) Take by mouth.    [provider]  fluticasone (FLONASE) 50 MCG/ACT nasal spray Place 2 sprays into both nostrils daily. 01/30/17    Loren Racer, MD  ibuprofen (ADVIL,MOTRIN) 600 MG tablet Take 1 tablet (600 mg total) by mouth every 6 (six) hours as needed. 01/30/17   Loren Racer, MD  loratadine (CLARITIN) 10 MG tablet Take 1 tablet (10 mg total) by mouth daily. 01/30/17   Loren Racer, MD  naproxen (NAPROSYN) 500 MG tablet Take 1 tablet (500 mg total) by mouth 2 (two) times daily. 11/15/12   Vanetta Mulders, MD    Family History No family history on file.  Social History Social History  Substance Use Topics  . Smoking status: Never Smoker  . Smokeless tobacco: Never Used  . Alcohol use Yes     Comment: weekends     Allergies   Patient has no known allergies.   Review of Systems Review of Systems  Constitutional: Negative for chills and fever.  HENT: Positive for congestion, sinus pain and sinus pressure. Negative for sore throat.   Eyes: Negative for photophobia and visual disturbance.  Respiratory: Positive for cough, chest tightness and shortness of breath.   Cardiovascular: Negative for chest pain.  Gastrointestinal: Negative for abdominal pain, constipation, diarrhea, nausea and vomiting.  Genitourinary: Negative for dysuria, flank pain and frequency.  Musculoskeletal: Positive for myalgias and neck pain. Negative for back pain and neck stiffness.  Neurological: Positive for headaches. Negative for dizziness, weakness, light-headedness and numbness.  All other systems reviewed and are negative.    Physical Exam  Updated Vital Signs BP 110/70 (BP Location: Right Arm)   Pulse 83   Temp 98 F (36.7 C) (Oral)   Resp 18   Ht 5\' 3"  (1.6 m)   Wt 300 lb (136.1 kg)   LMP 01/09/2017 (Approximate)   SpO2 100%   BMI 53.14 kg/m   Physical Exam  Constitutional: She is oriented to person, place, and time. She appears well-developed and well-nourished. No distress.  HENT:  Head: Normocephalic and atraumatic.  Mouth/Throat: Oropharynx is clear and moist.  Bilateral nasal mucosal edema.  Patient has left maxillary sinus tenderness to percussion.  Eyes: EOM are normal. Pupils are equal, round, and reactive to light.  Neck: Normal range of motion. Neck supple.  No meningismus. Patient does have muscle spasm and tenderness of the left trapezius.  Cardiovascular: Normal rate and regular rhythm.  Exam reveals no gallop and no friction rub.   No murmur heard. Pulmonary/Chest: Effort normal. No respiratory distress. She has no wheezes. She has no rales. She exhibits no tenderness.  Mildly prolonged expiratory phase. No definite wheezing.  Abdominal: Soft. Bowel sounds are normal. There is no tenderness. There is no rebound and no guarding.  Musculoskeletal: Normal range of motion. She exhibits no edema or tenderness.  No lower extremity swelling, asymmetry or tenderness.  Lymphadenopathy:    She has no cervical adenopathy.  Neurological: She is alert and oriented to person, place, and time.  5/5 motor in all extremities. Sensation fully intact. Patient is ambulating without difficulty.  Skin: Skin is warm and dry. Capillary refill takes less than 2 seconds. No rash noted. No erythema.  Psychiatric: She has a normal mood and affect. Her behavior is normal.  Nursing note and vitals reviewed.    ED Treatments / Results  Labs (all labs ordered are listed, but only abnormal results are displayed) Labs Reviewed - No data to display  EKG  EKG Interpretation None       Radiology Dg Chest 2 View  Result Date: 01/30/2017 CLINICAL DATA:  Chest pain. EXAM: CHEST  2 VIEW COMPARISON:  Radiographs of Feb 04, 2016. FINDINGS: The heart size and mediastinal contours are within normal limits. Both lungs are clear. No pneumothorax or pleural effusion is noted. The visualized skeletal structures are unremarkable. IMPRESSION: No active cardiopulmonary disease. Electronically Signed   By: Lupita Raider, M.D.   On: 01/30/2017 08:27    Procedures Procedures (including critical care  time)  Medications Ordered in ED Medications  ketorolac (TORADOL) injection 60 mg (60 mg Intramuscular Given 01/30/17 0751)  oxymetazoline (AFRIN) 0.05 % nasal spray 2 spray (2 sprays Each Nare Given 01/30/17 0751)     Initial Impression / Assessment and Plan / ED Course  I have reviewed the triage vital signs and the nursing notes.  Pertinent labs & imaging results that were available during my care of the patient were reviewed by me and considered in my medical decision making (see chart for details).     Patient's symptoms are likely allergy related. No red flag signs or symptoms regarding her left facial pain. We'll treat for left maxillary sinusitis. Patient's chest tightness and shortness of breath also is likely related to allergies. We'll screen with EKG and chest x-ray. Low suspicion for coronary artery disease or PE. Patient states she is feeling much better after medication. Chest x-ray and EKG are normal. [place] Again likely symptoms are allergy related. Return precautions given. Final Clinical  Impressions(s) / ED Diagnoses   Final diagnoses:  Sinus headache  Seasonal allergic rhinitis, unspecified trigger    New Prescriptions New Prescriptions   FLUTICASONE (FLONASE) 50 MCG/ACT NASAL SPRAY    Place 2 sprays into both nostrils daily.   IBUPROFEN (ADVIL,MOTRIN) 600 MG TABLET    Take 1 tablet (600 mg total) by mouth every 6 (six) hours as needed.   LORATADINE (CLARITIN) 10 MG TABLET    Take 1 tablet (10 mg total) by mouth daily.     Loren RacerYelverton, Quavis Klutz, MD 01/30/17 [phone]

## 2017-03-18 ENCOUNTER — Emergency Department (HOSPITAL_BASED_OUTPATIENT_CLINIC_OR_DEPARTMENT_OTHER)
Admission: EM | Admit: 2017-03-18 | Discharge: 2017-03-19 | Disposition: A | Discharge status: Home / Self Care | Payer: Medicaid - Out of State | Attending: Emergency Medicine | Admitting: Emergency Medicine

## 2017-03-18 ENCOUNTER — Encounter (HOSPITAL_BASED_OUTPATIENT_CLINIC_OR_DEPARTMENT_OTHER): Payer: Self-pay

## 2017-03-18 DIAGNOSIS — B9689 Other specified bacterial agents as the cause of diseases classified elsewhere: Secondary | ICD-10-CM

## 2017-03-18 DIAGNOSIS — R1031 Right lower quadrant pain: Secondary | ICD-10-CM

## 2017-03-18 DIAGNOSIS — N760 Acute vaginitis: Secondary | ICD-10-CM | POA: Diagnosis not present

## 2017-03-18 DIAGNOSIS — I10 Essential (primary) hypertension: Secondary | ICD-10-CM | POA: Diagnosis not present

## 2017-03-18 DIAGNOSIS — A599 Trichomoniasis, unspecified: Secondary | ICD-10-CM | POA: Diagnosis not present

## 2017-03-18 DIAGNOSIS — R102 Pelvic and perineal pain: Secondary | ICD-10-CM

## 2017-03-18 LAB — URINALYSIS, MICROSCOPIC (REFLEX)

## 2017-03-18 LAB — COMPREHENSIVE METABOLIC PANEL
ALT: 21 U/L (ref 14–54)
AST: 23 U/L (ref 15–41)
Albumin: 3.5 g/dL (ref 3.5–5.0)
Alkaline Phosphatase: 62 U/L (ref 38–126)
Anion gap: 7 (ref 5–15)
BILIRUBIN TOTAL: 0.5 mg/dL (ref 0.3–1.2)
BUN: 11 mg/dL (ref 6–20)
CALCIUM: 8.6 mg/dL — AB (ref 8.9–10.3)
CO2: 27 mmol/L (ref 22–32)
CREATININE: 0.71 mg/dL (ref 0.44–1.00)
Chloride: 103 mmol/L (ref 101–111)
Glucose, Bld: 106 mg/dL — ABNORMAL HIGH (ref 65–99)
Potassium: 3.4 mmol/L — ABNORMAL LOW (ref 3.5–5.1)
Sodium: 137 mmol/L (ref 135–145)
TOTAL PROTEIN: 7.5 g/dL (ref 6.5–8.1)

## 2017-03-18 LAB — CBC WITH DIFFERENTIAL/PLATELET
BASOS ABS: 0 10*3/uL (ref 0.0–0.1)
BASOS PCT: 0 %
EOS ABS: 0.1 10*3/uL (ref 0.0–0.7)
EOS PCT: 2 %
HCT: 37.3 % (ref 36.0–46.0)
HEMOGLOBIN: 12.7 g/dL (ref 12.0–15.0)
LYMPHS ABS: 2 10*3/uL (ref 0.7–4.0)
Lymphocytes Relative: 31 %
MCH: 29.5 pg (ref 26.0–34.0)
MCHC: 34 g/dL (ref 30.0–36.0)
MCV: 86.5 fL (ref 78.0–100.0)
Monocytes Absolute: 0.6 10*3/uL (ref 0.1–1.0)
Monocytes Relative: 9 %
NEUTROS PCT: 58 %
Neutro Abs: 3.7 10*3/uL (ref 1.7–7.7)
PLATELETS: 297 10*3/uL (ref 150–400)
RBC: 4.31 MIL/uL (ref 3.87–5.11)
RDW: 15.1 % (ref 11.5–15.5)
WBC: 6.4 10*3/uL (ref 4.0–10.5)

## 2017-03-18 LAB — URINALYSIS, ROUTINE W REFLEX MICROSCOPIC
Bilirubin Urine: NEGATIVE
Glucose, UA: NEGATIVE mg/dL
KETONES UR: NEGATIVE mg/dL
NITRITE: NEGATIVE
PH: 6 (ref 5.0–8.0)
Protein, ur: NEGATIVE mg/dL
Specific Gravity, Urine: 1.025 (ref 1.005–1.030)

## 2017-03-18 LAB — WET PREP, GENITAL
Sperm: NONE SEEN
YEAST WET PREP: NONE SEEN

## 2017-03-18 LAB — PREGNANCY, URINE: Preg Test, Ur: NEGATIVE

## 2017-03-18 LAB — LIPASE, BLOOD: LIPASE: 24 U/L (ref 11–51)

## 2017-03-18 IMAGING — DX DG CHEST 2V
2 series · 2 of 2 positions shown · non-contrast
Comparison: November 15, 2012

CLINICAL DATA: Shortness of breath for 2 days

EXAM:
CHEST  2 VIEW

[chest pa]
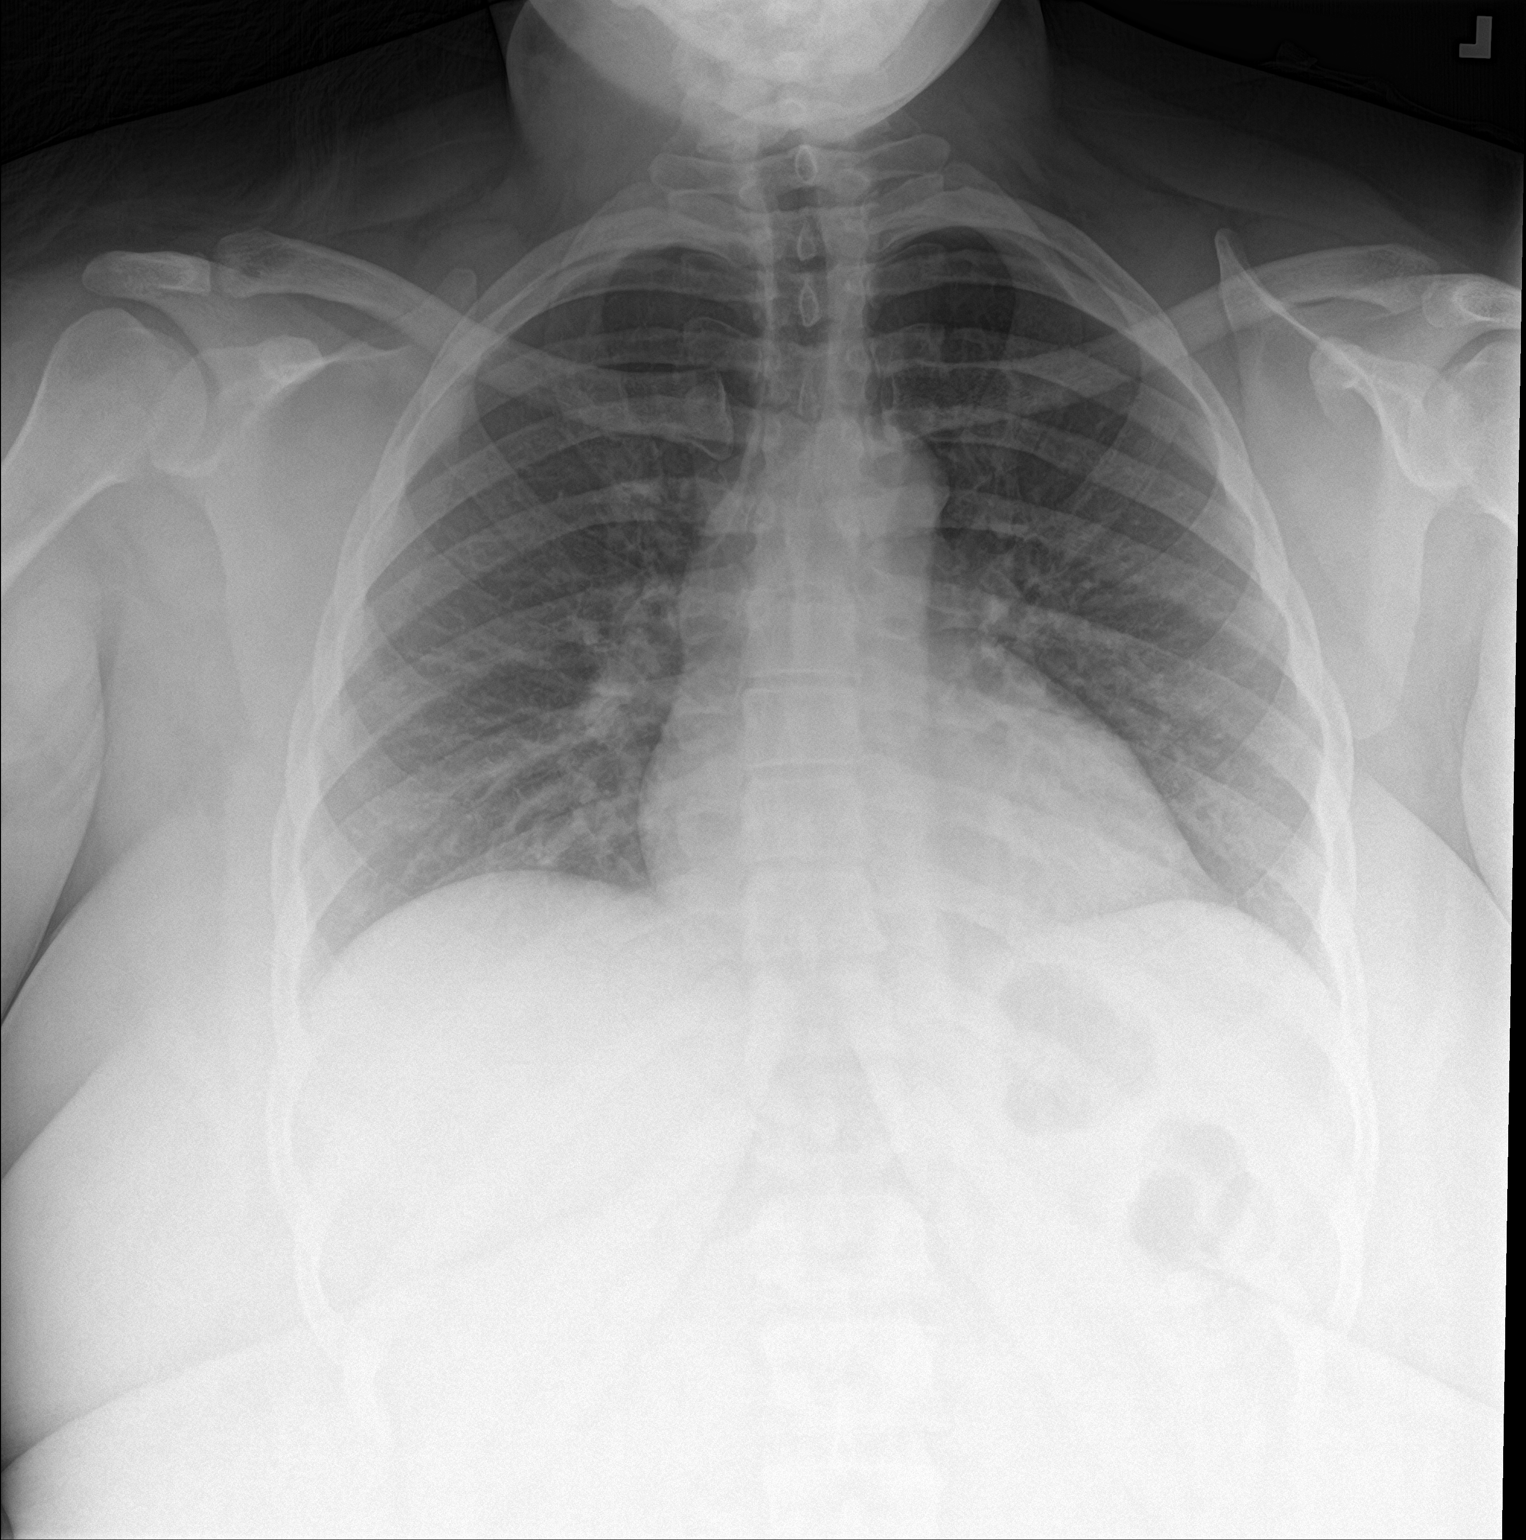

[chest lat]
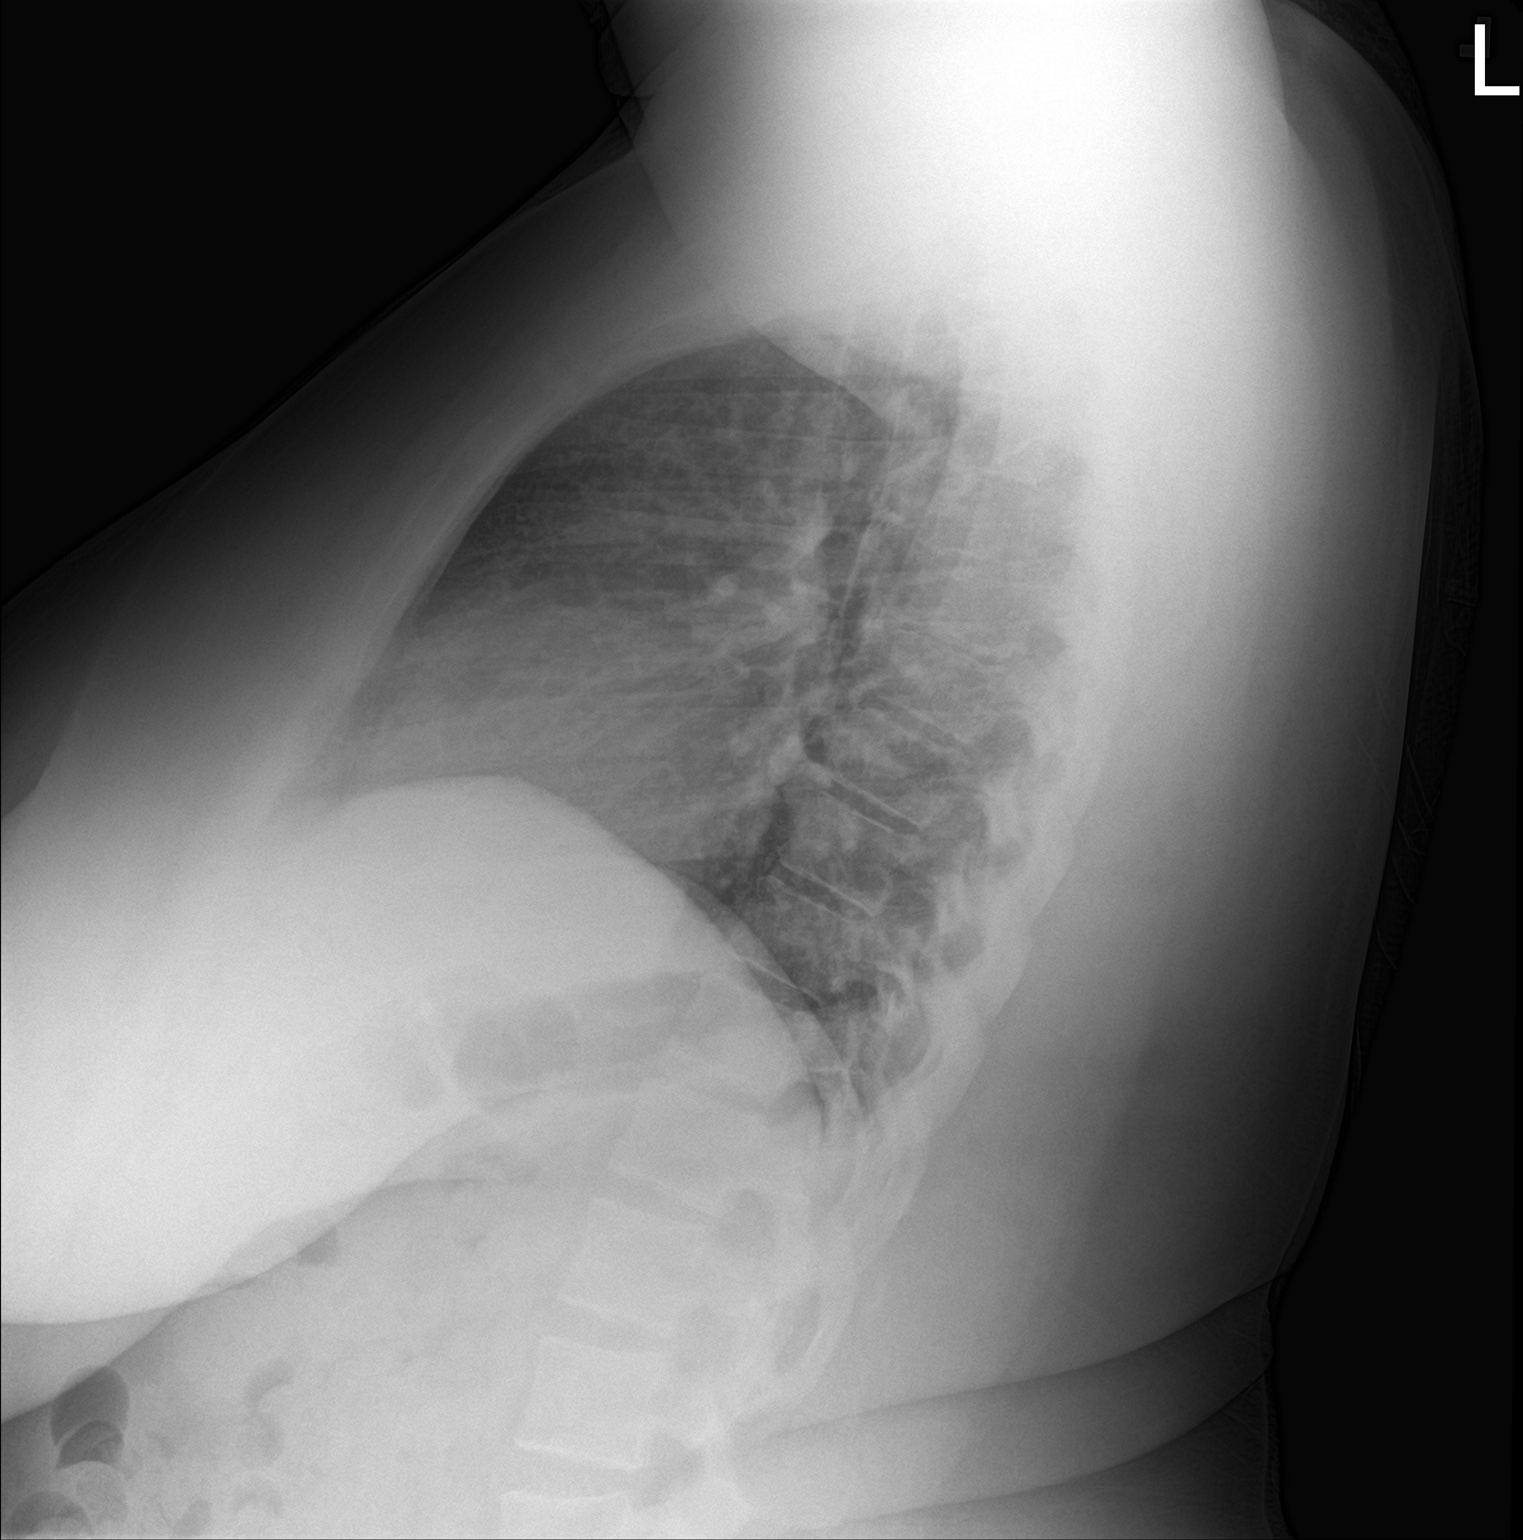

[2 of 2 positions shown; findings below may reference images not displayed]

FINDINGS: There is no edema or consolidation. The heart size and pulmonary
vascularity are normal. No adenopathy. No bone lesions. There is
minimal upper thoracic levoscoliosis.
IMPRESSION: No edema or consolidation.

## 2017-03-18 MED ORDER — METRONIDAZOLE 500 MG PO TABS
2000.0000 mg | ORAL_TABLET | Freq: Once | ORAL | Status: AC
  Administered 2017-03-18: 2000 mg via ORAL
  Filled 2017-03-18: qty 4

## 2017-03-18 MED ORDER — GI COCKTAIL ~~LOC~~
30.0000 mL | Freq: Once | ORAL | Status: AC
  Administered 2017-03-18: 30 mL via ORAL
  Filled 2017-03-18: qty 30

## 2017-03-18 MED ORDER — CEFTRIAXONE SODIUM 250 MG IJ SOLR
250.0000 mg | Freq: Once | INTRAMUSCULAR | Status: AC
  Administered 2017-03-18: 250 mg via INTRAMUSCULAR
  Filled 2017-03-18: qty 250

## 2017-03-18 MED ORDER — AZITHROMYCIN 250 MG PO TABS
1000.0000 mg | ORAL_TABLET | Freq: Once | ORAL | Status: AC
  Administered 2017-03-18: 1000 mg via ORAL
  Filled 2017-03-18: qty 4

## 2017-03-18 MED ORDER — METRONIDAZOLE 500 MG PO TABS: 500.0000 mg | ORAL_TABLET | Freq: Two times a day (BID) | ORAL | 0 refills | Status: DC

## 2017-03-18 NOTE — Discharge Instructions (Signed)
Take Flagyl twice daily for 7 days. Follow-up with PCP for further evaluation. Return to ED if severe abdominal pain, increased discharge, abnormal bleeding, trouble breathing, lightheadedness, consciousness.

## 2017-03-18 NOTE — ED Triage Notes (Signed)
C/o RLQ pain, vaginal d/c, fatigue x 2 days-NAD-steady gait

## 2017-03-18 NOTE — ED Provider Notes (Signed)
[facility]   CSN: [id number] Arrival date & time: 03/18/17  2122  By signing my name below, I, Sonum Patel, attest that this documentation has been prepared under the direction and in the presence of Kerrigan Gombos, PA-C. Electronically Signed: Leone PayorSonum Patel, Scribe. 03/18/17. 10:36 PM.  History   Chief Complaint Chief Complaint  Patient presents with  . Abdominal Pain    The history is provided by the patient. No language interpreter was used.     HPI Comments: Renee Moore is a 31 y.o. female who presents to the Emergency Department complaining of constant, unchanged RLQ abdominal pain with associated fatigue and generalized myalgias for the past 2 days. She notes vaginal discharge, Dysuria and heart burn symptoms that began today. She denies taking any OTC medications for her symptoms. She denies diarrhea, constipation, dysuria, hematuria, vaginal bleeding, cold symptoms, sore throat,  CP, SOB, cough, fever, leg swelling, hemoptysis. She has a history of an appendectomy and BTL.   Past Medical History:  Diagnosis Date  . Hypertension     There are no active problems to display for this patient.   Past Surgical History:  Procedure Laterality Date  . APPENDECTOMY    . BREAST CYST EXCISION    . TUBAL LIGATION      OB History    No data available       Home Medications    Prior to Admission medications   Medication Sig Start Date End Date Taking? Authorizing Provider  metroNIDAZOLE (FLAGYL) 500 MG tablet Take 1 tablet (500 mg total) by mouth 2 (two) times daily. 03/18/17   Dietrich PatesKhatri, Polly Barner, PA-C    Family History No family history on file.  Social History Social History  Substance Use Topics  . Smoking status: Never Smoker  . Smokeless tobacco: Never Used  . Alcohol use Yes     Comment: weekends     Allergies   Patient has no known allergies.   Review of Systems Review of Systems  Constitutional: Positive for fatigue. Negative for  fever.  Respiratory: Negative for cough and shortness of breath.   Cardiovascular: Negative for chest pain and leg swelling.  Gastrointestinal: Positive for abdominal pain. Negative for constipation.  Genitourinary: Positive for vaginal discharge. Negative for dysuria and hematuria.  Musculoskeletal: Positive for arthralgias and myalgias.  All other systems reviewed and are negative.    Physical Exam Updated Vital Signs BP 122/80 (BP Location: Right Wrist)   Pulse 96   Temp 98.9 F (37.2 C) (Oral)   Resp 18   Ht 5\' 3"  (1.6 m)   Wt (!) 144.2 kg (318 lb)   LMP 03/12/2017   SpO2 98%   BMI 56.33 kg/m   Physical Exam  Constitutional: She appears well-developed and well-nourished. No distress.  HENT:  Head: Normocephalic and atraumatic.  Nose: Nose normal.  Eyes: Conjunctivae and EOM are normal. Left eye exhibits no discharge. No scleral icterus.  Neck: Normal range of motion. Neck supple.  Cardiovascular: Normal rate, regular rhythm, normal heart sounds and intact distal pulses.  Exam reveals no gallop and no friction rub.   No murmur heard. Pulmonary/Chest: Effort normal and breath sounds normal. No respiratory distress. She has no wheezes. She has no rales.  Abdominal: Soft. Bowel sounds are normal. She exhibits no distension. There is tenderness (RLQ). There is no rebound and no guarding.  Well healed surgical scar at McBurney's point.   Genitourinary: There is no lesion on the right labia. There  is no lesion on the left labia. Cervix exhibits no motion tenderness and no friability. Right adnexum displays no mass and no tenderness. Left adnexum displays no mass and no tenderness. Vaginal discharge found.  Genitourinary Comments: There is white discharge present in vaginal vault. No bleeding noted. No cervical motion tenderness. No adnexal tenderness on either side.  Musculoskeletal: Normal range of motion. She exhibits no edema.  Neurological: She is alert. She exhibits normal  muscle tone. Coordination normal.  Skin: Skin is warm and dry. No rash noted.  Psychiatric: She has a normal mood and affect.  Nursing note and vitals reviewed.    ED Treatments / Results  DIAGNOSTIC STUDIES: Oxygen Saturation is 98% on RA, normal by my interpretation.    COORDINATION OF CARE: 10:36 PM Discussed treatment plan with pt at bedside and pt agreed to plan.   Labs (all labs ordered are listed, but only abnormal results are displayed) Labs Reviewed  WET PREP, GENITAL - Abnormal; Notable for the following:       Result Value   Trich, Wet Prep PRESENT (*)    Clue Cells Wet Prep HPF POC PRESENT (*)    WBC, Wet Prep HPF POC MODERATE (*)    All other components within normal limits  URINALYSIS, ROUTINE W REFLEX MICROSCOPIC - Abnormal; Notable for the following:    APPearance CLOUDY (*)    Hgb urine dipstick TRACE (*)    Leukocytes, UA LARGE (*)    All other components within normal limits  URINALYSIS, MICROSCOPIC (REFLEX) - Abnormal; Notable for the following:    Bacteria, UA MANY (*)    Squamous Epithelial / LPF TOO NUMEROUS TO COUNT (*)    All other components within normal limits  COMPREHENSIVE METABOLIC PANEL - Abnormal; Notable for the following:    Potassium 3.4 (*)    Glucose, Bld 106 (*)    Calcium 8.6 (*)    All other components within normal limits  URINE CULTURE  PREGNANCY, URINE  CBC WITH DIFFERENTIAL/PLATELET  LIPASE, BLOOD  HIV ANTIBODY (ROUTINE TESTING)  RPR  GC/CHLAMYDIA PROBE AMP ([facility]) NOT AT [facility]    EKG  EKG Interpretation None       Radiology No results found.  Procedures Procedures (including critical care time)  Medications Ordered in ED Medications  gi cocktail (Maalox,Lidocaine,Donnatal) (30 mLs Oral Given 03/18/17 2356)  cefTRIAXone (ROCEPHIN) injection 250 mg (250 mg Intramuscular Given 03/18/17 2356)  azithromycin (ZITHROMAX) tablet 1,000 mg (1,000 mg Oral Given 03/18/17 2356)  metroNIDAZOLE (FLAGYL) tablet 2,000 mg  (2,000 mg Oral Given 03/18/17 2354)     Initial Impression / Assessment and Plan / ED Course  I have reviewed the triage vital signs and the nursing notes.  Pertinent labs & imaging results that were available during my care of the patient were reviewed by me and considered in my medical decision making (see chart for details).     Patient presents to ED for right lower quadrant abdominal pain, fatigue, heartburn and vaginal discharge. She reports history of appendectomy and bilateral tubal ligation several years ago. She denies any fevers and is afebrile at this time. No nausea, vomiting, chest pain or shortness of breath noted. On physical exam she is tender to palpation in the right lower quadrant but no rebound or guarding present. Low suspicion for acute intra-abdominal infectious process such as colitis or cholecystitis. No need for imaging indicated at this time. Pelvic exam revealed white discharge present in vaginal vault. CBC, CMP and lipase  unremarkable. UA showed possible evidence of UTI but many squamous cells. This was sent for culture. Wet prep showed Trichomonas, clue cells and WBC present. GC chlamydia, HIV and RPR pending at this time. Patient states that she would like to be empirically treated for GC chlamydia. We will treat for Trichomonas with one time dose of Flagyl as well. She reports symptomatic improvement of her heartburn with GI cocktail here. We'll discharge patient home with treatment for BV and advise her to follow-up with her PCP for further evaluation. I advised her to follow up with the results of her remaining blood work when available. She appears stable for discharge at this time. Strict return precautions given.  Final Clinical Impressions(s) / ED Diagnoses   Final diagnoses:  Trichomoniasis  Right lower quadrant abdominal pain  Pelvic pain in female  Bacterial vaginosis    New Prescriptions Discharge Medication List as of 03/18/2017 11:44 PM    START  taking these medications   Details  metroNIDAZOLE (FLAGYL) 500 MG tablet Take 1 tablet (500 mg total) by mouth 2 (two) times daily., Starting Thu 03/18/2017, Print       I personally performed the services described in this documentation, which was scribed in my presence. The recorded information has been reviewed and is accurate.    Dietrich Pates, PA-C 03/19/17 4540    Marily Memos, MD 03/20/17 1148

## 2017-03-19 MED FILL — metroNIDAZOLE 500 MG TABS: 500 | 7 days supply | Qty: 14 | Fill #0

## 2017-03-19 NOTE — ED Notes (Signed)
Pt verbalizes understanding of d/c instructions and denies any further needs at this time. 

## 2017-03-20 LAB — URINE CULTURE

## 2017-03-20 LAB — RPR: RPR Ser Ql: NONREACTIVE

## 2017-03-20 LAB — HIV ANTIBODY (ROUTINE TESTING W REFLEX): HIV SCREEN 4TH GENERATION: NONREACTIVE

## 2017-03-22 LAB — GC/CHLAMYDIA PROBE AMP (~~LOC~~) NOT AT ARMC
Chlamydia: NEGATIVE
NEISSERIA GONORRHEA: NEGATIVE

## 2017-04-27 ENCOUNTER — Emergency Department (HOSPITAL_BASED_OUTPATIENT_CLINIC_OR_DEPARTMENT_OTHER)
Admission: EM | Admit: 2017-04-27 | Discharge: 2017-04-28 | Disposition: A | Discharge status: Home / Self Care | Payer: Medicaid - Out of State | Attending: Emergency Medicine | Admitting: Emergency Medicine

## 2017-04-27 ENCOUNTER — Encounter (HOSPITAL_BASED_OUTPATIENT_CLINIC_OR_DEPARTMENT_OTHER): Payer: Self-pay

## 2017-04-27 DIAGNOSIS — R1030 Lower abdominal pain, unspecified: Secondary | ICD-10-CM

## 2017-04-27 DIAGNOSIS — M62838 Other muscle spasm: Secondary | ICD-10-CM | POA: Diagnosis not present

## 2017-04-27 DIAGNOSIS — B9689 Other specified bacterial agents as the cause of diseases classified elsewhere: Secondary | ICD-10-CM

## 2017-04-27 DIAGNOSIS — R350 Frequency of micturition: Secondary | ICD-10-CM | POA: Diagnosis not present

## 2017-04-27 DIAGNOSIS — N760 Acute vaginitis: Secondary | ICD-10-CM | POA: Insufficient documentation

## 2017-04-27 DIAGNOSIS — R1032 Left lower quadrant pain: Secondary | ICD-10-CM | POA: Insufficient documentation

## 2017-04-27 DIAGNOSIS — M542 Cervicalgia: Secondary | ICD-10-CM | POA: Diagnosis present

## 2017-04-27 DIAGNOSIS — I10 Essential (primary) hypertension: Secondary | ICD-10-CM | POA: Insufficient documentation

## 2017-04-27 NOTE — ED Triage Notes (Signed)
Pt c/o pain all the way down her left side for the last several days, no injury, is under a lot of stress as she has had several family members pass away

## 2017-04-28 LAB — URINALYSIS, ROUTINE W REFLEX MICROSCOPIC
BILIRUBIN URINE: NEGATIVE
Glucose, UA: NEGATIVE mg/dL
Hgb urine dipstick: NEGATIVE
KETONES UR: NEGATIVE mg/dL
NITRITE: NEGATIVE
PROTEIN: NEGATIVE mg/dL
SPECIFIC GRAVITY, URINE: 1.013 (ref 1.005–1.030)
pH: 7 (ref 5.0–8.0)

## 2017-04-28 LAB — URINALYSIS, MICROSCOPIC (REFLEX)

## 2017-04-28 LAB — PREGNANCY, URINE: PREG TEST UR: NEGATIVE

## 2017-04-28 LAB — WET PREP, GENITAL
Sperm: NONE SEEN
Trich, Wet Prep: NONE SEEN
YEAST WET PREP: NONE SEEN

## 2017-04-28 MED ORDER — METRONIDAZOLE 500 MG PO TABS: 500.0000 mg | ORAL_TABLET | Freq: Two times a day (BID) | ORAL | 0 refills | Status: DC

## 2017-04-28 MED ORDER — METHOCARBAMOL 500 MG PO TABS: 500.0000 mg | ORAL_TABLET | Freq: Three times a day (TID) | ORAL | 0 refills | Status: AC | PRN

## 2017-04-28 MED ORDER — IBUPROFEN 800 MG PO TABS: 800.0000 mg | ORAL_TABLET | Freq: Three times a day (TID) | ORAL | 0 refills | Status: AC | PRN

## 2017-04-28 MED ORDER — IBUPROFEN 800 MG PO TABS
800.0000 mg | ORAL_TABLET | Freq: Once | ORAL | Status: AC
  Administered 2017-04-28: 800 mg via ORAL
  Filled 2017-04-28: qty 1

## 2017-04-28 NOTE — ED Provider Notes (Signed)
TIME SEEN: 12:09 AM  CHIEF COMPLAINT: Left neck pain, vaginal discharge and left lower abdominal pain  HPI: Patient is a 31 year old female with history of hypertension who presents to the emergency department with multiple complaints. She complains of several days of left neck pain that is worse with palpation and movement of the left shoulder. No injury to the neck or arm. Described as a aching, throbbing pain. Has tried Tylenol without much relief. No numbness, tingling or focal weakness. No bowel or bladder incontinence. No urinary retention.   Patient also complains of several days of left lower quadrant abdominal pain she describes as sharp in nature with associated vaginal discharge and odor. Has had urinary frequency. Was just here in the emergency department for similar symptoms and had right lower quadrant pain. She is status post appendectomy and BTL. States that she was diagnosed with Trichomonas. She was empirically covered for gonorrhea, Chlamydia and Trichomonas and discharge with prescription of metronidazole for bacterial vaginosis. States she lost this prescription and she never completed these antibiotics. She denies any fevers, chills, nausea, vomiting, diarrhea. No dysuria or hematuria but has again had some urinary frequency. Has had Trichomonas twice before. No other STDs.  ROS: See HPI Constitutional: no fever  Eyes: no drainage  ENT: no runny nose   Cardiovascular:  no chest pain  Resp: no SOB  GI: no vomiting GU: no dysuria Integumentary: no rash  Allergy: no hives  Musculoskeletal: no leg swelling  Neurological: no slurred speech ROS otherwise negative  PAST MEDICAL HISTORY/PAST SURGICAL HISTORY:  Past Medical History:  Diagnosis Date  . Hypertension     MEDICATIONS:  Prior to Admission medications   Medication Sig Start Date End Date Taking? Authorizing Provider  metroNIDAZOLE (FLAGYL) 500 MG tablet Take 1 tablet (500 mg total) by mouth 2 (two) times daily.  03/18/17   Dietrich PatesKhatri, Hina, PA-C    ALLERGIES:  No Known Allergies  SOCIAL HISTORY:  Social History  Substance Use Topics  . Smoking status: Never Smoker  . Smokeless tobacco: Never Used  . Alcohol use Yes     Comment: weekends    FAMILY HISTORY: No family history on file.  EXAM: BP 125/89 (BP Location: Right Arm)   Pulse 86   Temp 98.8 F (37.1 C) (Oral)   Resp 18   Ht 5\' 4"  (1.626 m)   Wt (!) 140.6 kg (310 lb)   LMP 04/06/2017   SpO2 100%   BMI 53.21 kg/m  CONSTITUTIONAL: Alert and oriented and responds appropriately to questions. Well-appearing; well-nourished, Obese, afebrile, nontoxic HEAD: Normocephalic EYES: Conjunctivae clear, pupils appear equal, EOMI ENT: normal nose; moist mucous membranes NECK: Supple, no meningismus, no nuchal rigidity, no LAD; tender to palpation with associated muscle spasm over the left trapezius muscle there is no erythema, warmth or swelling. There is no midline spinal tenderness or step-off or deformity. CARD: RRR; S1 and S2 appreciated; no murmurs, no clicks, no rubs, no gallops RESP: Normal chest excursion without splinting or tachypnea; breath sounds clear and equal bilaterally; no wheezes, no rhonchi, no rales, no hypoxia or respiratory distress, speaking full sentences ABD/GI: Normal bowel sounds; non-distended; soft, non-tender, no rebound, no guarding, no peritoneal signs, no hepatosplenomegaly GU:  Normal external genitalia. No lesions, rashes noted. Patient has no vaginal bleeding on exam. Moderate amount of white foul-smelling vaginal discharge.  No adnexal tenderness, mass or fullness, no cervical motion tenderness. Cervix is not appear friable.  Cervix is closed.  Chaperone present for exam. BACK:  The back appears normal and is non-tender to palpation, there is no CVA tenderness EXT: Normal ROM in all joints; non-tender to palpation; no edema; normal capillary refill; no cyanosis, no calf tenderness or swelling; no bony tenderness  or bony deformity over the left shoulder, humerus, elbow, forearm, wrist or hand. 2+ radial pulses bilaterally. Left extremity is warm and well-perfused without soft tissue swelling. Compartments are soft. There is no erythema or warmth noted to the left upper extremity. She has full range of motion in all joints. SKIN: Normal color for age and race; warm; no rash NEURO: Moves all extremities equally, normal gait, sensation to light touch intact diffusely, no clonus, no saddle anesthesia PSYCH: The patient's mood and manner are appropriate. Grooming and personal hygiene are appropriate.  MEDICAL DECISION MAKING: Patient here with complaints of left-sided neck pain. She appears have a trapezius muscle spasm. Recommended ibuprofen, muscle relaxers. She did drive herself to the emergency department and has her young daughter here with her. Discussed with her if she would like to drive him we cannot give her any medications that may make her drowsy prior to leaving. She is comfortable with this plan. No midline neck tenderness or neurologic deficits. Doubt spinal stenosis, epidural abscess or hematoma, cauda equina, discitis, transverse myelitis. Doubt fracture of the neck. She does report that ibuprofen which we provided her in the emergency department has helped with her pain.   Patient also complaining of left-sided pelvic pain or vaginal discharge. Abdominal exam is benign. She has no adnexal tenderness, fullness or cervical motion tenderness. Recently tested for STDs which were all negative except for her Trichomonas and she was given empiric treatment for this. Her wet prep today shows no Trichomonas she does have clue cells. We'll discharge with another prescription of metronidazole for bacterial vaginosis. Her urine shows small leukocytes and many bacteria but also many squamous cells. I do not think that this is a urinary tract infection. She reports she does have a OB/GYN for follow-up.  Doubt PID,  TOA, torsion, diverticulitis, colitis, bowel obstruction based on her benign exam.   At this time, I do not feel there is any life-threatening condition present. I have reviewed and discussed all results (EKG, imaging, lab, urine as appropriate) and exam findings with patient/family. I have reviewed nursing notes and appropriate previous records.  I feel the patient is safe to be discharged home without further emergent workup and can continue workup as an outpatient as needed. Discussed usual and customary return precautions. Patient/family verbalize understanding and are comfortable with this plan.  Outpatient follow-up has been provided if needed. All questions have been answered.      Derinda Bartus, Layla MawKristen N, DO 04/28/17 [phone]

## 2017-04-30 LAB — GC/CHLAMYDIA PROBE AMP (~~LOC~~) NOT AT ARMC
CHLAMYDIA, DNA PROBE: NEGATIVE
Neisseria Gonorrhea: NEGATIVE

## 2019-04-04 ENCOUNTER — Encounter (HOSPITAL_BASED_OUTPATIENT_CLINIC_OR_DEPARTMENT_OTHER): Payer: Self-pay

## 2019-04-04 ENCOUNTER — Emergency Department (HOSPITAL_BASED_OUTPATIENT_CLINIC_OR_DEPARTMENT_OTHER)
Admission: EM | Admit: 2019-04-04 | Discharge: 2019-04-04 | Disposition: A | Discharge status: Home / Self Care | Payer: Medicaid - Out of State | Attending: Emergency Medicine | Admitting: Emergency Medicine

## 2019-04-04 ENCOUNTER — Other Ambulatory Visit: Payer: Self-pay

## 2019-04-04 DIAGNOSIS — R1013 Epigastric pain: Secondary | ICD-10-CM | POA: Insufficient documentation

## 2019-04-04 DIAGNOSIS — R1030 Lower abdominal pain, unspecified: Secondary | ICD-10-CM | POA: Insufficient documentation

## 2019-04-04 DIAGNOSIS — I10 Essential (primary) hypertension: Secondary | ICD-10-CM | POA: Insufficient documentation

## 2019-04-04 DIAGNOSIS — R102 Pelvic and perineal pain: Secondary | ICD-10-CM | POA: Insufficient documentation

## 2019-04-04 DIAGNOSIS — B9689 Other specified bacterial agents as the cause of diseases classified elsewhere: Secondary | ICD-10-CM

## 2019-04-04 DIAGNOSIS — N3001 Acute cystitis with hematuria: Secondary | ICD-10-CM | POA: Insufficient documentation

## 2019-04-04 DIAGNOSIS — N760 Acute vaginitis: Secondary | ICD-10-CM | POA: Insufficient documentation

## 2019-04-04 LAB — COMPREHENSIVE METABOLIC PANEL
ALT: 15 U/L (ref 0–44)
AST: 16 U/L (ref 15–41)
Albumin: 3.7 g/dL (ref 3.5–5.0)
Alkaline Phosphatase: 62 U/L (ref 38–126)
Anion gap: 12 (ref 5–15)
BUN: 14 mg/dL (ref 6–20)
CO2: 25 mmol/L (ref 22–32)
Calcium: 8.9 mg/dL (ref 8.9–10.3)
Chloride: 99 mmol/L (ref 98–111)
Creatinine, Ser: 0.65 mg/dL (ref 0.44–1.00)
GFR calc Af Amer: >60 mL/min (ref >60)
GFR calc non Af Amer: >60 mL/min (ref >60)
Glucose, Bld: 92 mg/dL (ref 70–99)
Potassium: 3.5 mmol/L (ref 3.5–5.1)
Sodium: 136 mmol/L (ref 135–145)
Total Bilirubin: 0.8 mg/dL (ref 0.3–1.2)
Total Protein: 7.9 g/dL (ref 6.5–8.1)

## 2019-04-04 LAB — URINALYSIS, ROUTINE W REFLEX MICROSCOPIC
Bilirubin Urine: NEGATIVE
Glucose, UA: NEGATIVE mg/dL
Ketones, ur: NEGATIVE mg/dL
Nitrite: NEGATIVE
Protein, ur: 100 mg/dL — AB
Specific Gravity, Urine: >1.03 — ABNORMAL HIGH (ref 1.005–1.030)
pH: 6 (ref 5.0–8.0)

## 2019-04-04 LAB — CBC WITH DIFFERENTIAL/PLATELET
Abs Immature Granulocytes: 0.02 10*3/uL (ref 0.00–0.07)
Basophils Absolute: 0 10*3/uL (ref 0.0–0.1)
Basophils Relative: 0 %
Eosinophils Absolute: 0.1 10*3/uL (ref 0.0–0.5)
Eosinophils Relative: 1 %
HCT: 39.1 % (ref 36.0–46.0)
Hemoglobin: 12.6 g/dL (ref 12.0–15.0)
Immature Granulocytes: 0 %
Lymphocytes Relative: 26 %
Lymphs Abs: 2.1 10*3/uL (ref 0.7–4.0)
MCH: 29.2 pg (ref 26.0–34.0)
MCHC: 32.2 g/dL (ref 30.0–36.0)
MCV: 90.5 fL (ref 80.0–100.0)
Monocytes Absolute: 0.5 10*3/uL (ref 0.1–1.0)
Monocytes Relative: 6 %
Neutro Abs: 5.5 10*3/uL (ref 1.7–7.7)
Neutrophils Relative %: 67 %
Platelets: 317 10*3/uL (ref 150–400)
RBC: 4.32 MIL/uL (ref 3.87–5.11)
RDW: 14.7 % (ref 11.5–15.5)
WBC: 8.2 10*3/uL (ref 4.0–10.5)
nRBC: 0 % (ref 0.0–0.2)

## 2019-04-04 LAB — WET PREP, GENITAL
Sperm: NONE SEEN
Trich, Wet Prep: NONE SEEN
Yeast Wet Prep HPF POC: NONE SEEN

## 2019-04-04 LAB — LIPASE, BLOOD: Lipase: 24 U/L (ref 11–51)

## 2019-04-04 LAB — URINALYSIS, MICROSCOPIC (REFLEX): RBC / HPF: >50 RBC/hpf (ref 0–5)

## 2019-04-04 LAB — PREGNANCY, URINE: Preg Test, Ur: NEGATIVE

## 2019-04-04 MED ORDER — CEPHALEXIN 500 MG PO CAPS: 500.0000 mg | ORAL_CAPSULE | Freq: Three times a day (TID) | ORAL | 0 refills | Status: AC

## 2019-04-04 MED ORDER — ONDANSETRON HCL 4 MG/2ML IJ SOLN
4.0000 mg | Freq: Once | INTRAMUSCULAR | Status: AC
  Administered 2019-04-04: 4 mg via INTRAVENOUS
  Filled 2019-04-04: qty 2

## 2019-04-04 MED ORDER — ACETAMINOPHEN 500 MG PO TABS
1000.0000 mg | ORAL_TABLET | Freq: Once | ORAL | Status: AC
  Administered 2019-04-04: 21:00:00 1000 mg via ORAL
  Filled 2019-04-04: qty 2

## 2019-04-04 MED ORDER — FAMOTIDINE IN NACL 20-0.9 MG/50ML-% IV SOLN
20.0000 mg | Freq: Once | INTRAVENOUS | Status: AC
  Administered 2019-04-04: 20 mg via INTRAVENOUS
  Filled 2019-04-04: qty 50

## 2019-04-04 MED ORDER — METRONIDAZOLE 500 MG PO TABS: 500.0000 mg | ORAL_TABLET | Freq: Two times a day (BID) | ORAL | 0 refills | Status: DC

## 2019-04-04 MED ORDER — FAMOTIDINE 20 MG PO TABS: 20.0000 mg | ORAL_TABLET | Freq: Two times a day (BID) | ORAL | 0 refills | Status: AC

## 2019-04-04 MED ORDER — CEFTRIAXONE SODIUM 250 MG IJ SOLR: 250.0000 mg | Freq: Once | INTRAMUSCULAR | Status: DC

## 2019-04-04 MED ORDER — SODIUM CHLORIDE 0.9 % IV BOLUS
1000.0000 mL | Freq: Once | INTRAVENOUS | Status: AC
  Administered 2019-04-04: 1000 mL via INTRAVENOUS

## 2019-04-04 MED ORDER — AZITHROMYCIN 250 MG PO TABS: 1000.0000 mg | ORAL_TABLET | Freq: Once | ORAL | Status: DC

## 2019-04-04 NOTE — ED Provider Notes (Signed)
[facility] EMERGENCY DEPARTMENT Provider Note   CSN: [id number] Arrival date & time: 04/04/19  1920    History   Chief Complaint Chief Complaint  Patient presents with  . Abdominal Pain  . Pelvic Pain    HPI Renee Moore is a 33 y.o. female.     Renee Moore is a 33 y.o. female with a history of hypertension and previous appendectomy, who presents to the emergency department for evaluation of abdominal pain, pelvic pain and dysuria.  Patient reports 4 days ago she started having some intermittent upper abdominal pains in the left upper quadrant and epigastrium after eating.  She denies any right upper quadrant pain.  She reports some decreased appetite but no nausea or vomiting.  Had a few episodes of loose stools, nonbloody.  No constipation.  She reports that today she began having some lower abdominal discomfort, localized to the suprapubic region she also noted some burning and pain with urination and a small amount of blood after wiping.  He is unsure if this blood was from her urine or the vagina.  She denies any vaginal discharge.  Has not had any sexual partners in the last several months but does report some pelvic discomfort.  No fevers or chills, no sick contacts.  No other aggravating or alleviating factors.     Past Medical History:  Diagnosis Date  . Hypertension     There are no active problems to display for this patient.   Past Surgical History:  Procedure Laterality Date  . APPENDECTOMY    . BREAST CYST EXCISION    . TUBAL LIGATION       OB History   No obstetric history on file.      Home Medications    Prior to Admission medications   Medication Sig Start Date End Date Taking? Authorizing Provider  cephALEXin (KEFLEX) 500 MG capsule Take 1 capsule (500 mg total) by mouth 3 (three) times daily for 7 days. 04/04/19 04/11/19  Jacqlyn Larsen, PA-C  famotidine (PEPCID) 20 MG tablet Take 1 tablet (20 mg total) by mouth 2 (two) times daily.  04/04/19   Jacqlyn Larsen, PA-C  ibuprofen (ADVIL,MOTRIN) 800 MG tablet Take 1 tablet (800 mg total) by mouth every 8 (eight) hours as needed for mild pain. 04/28/17   Ward, Delice Bison, DO  methocarbamol (ROBAXIN) 500 MG tablet Take 1 tablet (500 mg total) by mouth every 8 (eight) hours as needed for muscle spasms. 04/28/17   Ward, Delice Bison, DO  metroNIDAZOLE (FLAGYL) 500 MG tablet Take 1 tablet (500 mg total) by mouth 2 (two) times daily. One po bid x 7 days 04/04/19   Jacqlyn Larsen, PA-C    Family History No family history on file.  Social History Social History   Tobacco Use  . Smoking status: Never Smoker  . Smokeless tobacco: Never Used  Substance Use Topics  . Alcohol use: Yes    Comment: weekends  . Drug use: No     Allergies   Patient has no known allergies.   Review of Systems Review of Systems  Constitutional: Negative for chills and fever.  HENT: Negative.   Respiratory: Negative for cough and shortness of breath.   Cardiovascular: Negative for chest pain.  Gastrointestinal: Positive for abdominal pain and diarrhea. Negative for blood in stool, constipation, nausea and vomiting.  Genitourinary: Positive for dysuria, frequency, pelvic pain and vaginal bleeding. Negative for flank pain and vaginal discharge.  Musculoskeletal: Negative  for arthralgias and myalgias.  Skin: Negative for color change and rash.  Neurological: Negative for dizziness, syncope and light-headedness.  All other systems reviewed and are negative.    Physical Exam Updated Vital Signs BP (!) 133/96 (BP Location: Left Arm)   Pulse (!) 102   Temp 98.8 F (37.1 C) (Oral)   Resp 18   Ht 5\' 3"  (1.6 m)   Wt (!) 144.7 kg   LMP 03/26/2019   SpO2 100%   BMI 56.51 kg/m   Physical Exam Vitals signs and nursing note reviewed. Exam conducted with a chaperone present.  Constitutional:      General: She is not in acute distress.    Appearance: She is well-developed. She is obese. She is not  ill-appearing or diaphoretic.  HENT:     Head: Normocephalic and atraumatic.     Mouth/Throat:     Mouth: Mucous membranes are moist.     Pharynx: Oropharynx is clear.  Eyes:     General:        Right eye: No discharge.        Left eye: No discharge.     Pupils: Pupils are equal, round, and reactive to light.  Neck:     Musculoskeletal: Neck supple.  Cardiovascular:     Rate and Rhythm: Normal rate and regular rhythm.     Heart sounds: Normal heart sounds.  Pulmonary:     Effort: Pulmonary effort is normal. No respiratory distress.     Breath sounds: Normal breath sounds. No wheezing or rales.     Comments: Respirations equal and unlabored, patient able to speak in full sentences, lungs clear to auscultation bilaterally Abdominal:     General: Bowel sounds are normal. There is no distension.     Palpations: Abdomen is soft. There is no mass.     Tenderness: There is abdominal tenderness in the epigastric area, suprapubic area and left upper quadrant. There is no right CVA tenderness, left CVA tenderness, guarding or rebound.     Comments: Abdomen is soft and nondistended, bowel sounds present throughout, there is some tenderness in the epigastric and left upper quadrants without guarding or rebound tenderness, no focal right upper quadrant tenderness.  There is also tenderness in the suprapubic region, no focal left or right lower quadrant pain.  No CVA tenderness bilaterally.  Genitourinary:    Comments: Chaperone present during pelvic exam. No external genital lesions noted. Speculum exam with small amount of thin gray-white discharge, no vaginal bleeding.  Cervix normal. On bimanual exam patient only with discomfort with palpation in the suprapubic region, no cervical motion tenderness and no adnexal tenderness bilaterally. Musculoskeletal:        General: No deformity.  Skin:    General: Skin is warm and dry.     Capillary Refill: Capillary refill takes less than 2 seconds.   Neurological:     Mental Status: She is alert.     Coordination: Coordination normal.     Comments: Speech is clear, able to follow commands Moves extremities without ataxia, coordination intact   Psychiatric:        Mood and Affect: Mood normal.        Behavior: Behavior normal.      ED Treatments / Results  Labs (all labs ordered are listed, but only abnormal results are displayed) Labs Reviewed  WET PREP, GENITAL - Abnormal; Notable for the following components:      Result Value   Clue Cells Wet Prep  HPF POC PRESENT (*)    WBC, Wet Prep HPF POC FEW (*)    All other components within normal limits  URINALYSIS, ROUTINE W REFLEX MICROSCOPIC - Abnormal; Notable for the following components:   APPearance CLOUDY (*)    Specific Gravity, Urine >1.030 (*)    Hgb urine dipstick LARGE (*)    Protein, ur 100 (*)    Leukocytes,Ua SMALL (*)    All other components within normal limits  URINALYSIS, MICROSCOPIC (REFLEX) - Abnormal; Notable for the following components:   Bacteria, UA FEW (*)    All other components within normal limits  URINE CULTURE  COMPREHENSIVE METABOLIC PANEL  LIPASE, BLOOD  CBC WITH DIFFERENTIAL/PLATELET  PREGNANCY, URINE  RPR  HIV ANTIBODY (ROUTINE TESTING W REFLEX)  GC/CHLAMYDIA PROBE AMP ([place]    EKG None  Radiology No results found.  Procedures Procedures (including critical care time)  Medications Ordered in ED Medications  sodium chloride 0.9 % bolus 1,000 mL (0 mLs Intravenous Stopped 04/04/19 2133)  ondansetron (ZOFRAN) injection 4 mg (4 mg Intravenous Given 04/04/19 2031)  famotidine (PEPCID) IVPB 20 mg premix (0 mg Intravenous Stopped 04/04/19 2133)  acetaminophen (TYLENOL) tablet 1,000 mg (1,000 mg Oral Given 04/04/19 2033)     Initial Impression / Assessment and Plan / ED Course  I have reviewed the triage vital signs and the nursing notes.  Pertinent labs & imaging results that were available during my care of the  patient were reviewed by me and considered in my medical decision making (see chart for details).  Patient presents with epigastric pain for 3 days, and today began having suprapubic pain and dysuria.  No fevers, no nausea or vomiting.  Few episodes of diarrhea.  No vaginal discharge or bleeding.  Patient is well-appearing.  No peritoneal signs on exam.  She has some epigastric and left upper quadrant tenderness I suspect patient may have had some mild gastritis this pain was worse after eating.  No focal right upper quadrant tenderness to suggest gallbladder pathology and she has normal LFTs and lipase.  Lower abdominal tenderness focally in the suprapubic region and patient started having some urinary symptoms today, I suspect UTI.  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  Reports she has not been sexually active in the last several months, does not think she could have an STD.  Exam not consistent with PID and no focal adnexal masses or pain.  Labs overall reassuring, no leukocytosis, normal LFTs and lipase as above, normal electrolytes and renal function.  Urinalysis with signs of infection, urine culture sent, will treat with Keflex given symptoms.  No CVA tenderness to suggest pyelonephritis.  Wet prep is also suggestive of BV, will treat with Flagyl.  Patient is aware she has STD testing pending and will need to notify any partners of positive results.  Will send prescriptions for Pepcid for suspected gastritis, Keflex and Flagyl.  Patient expresses understanding and agreement with plan.  Discharged home in good condition.  Final Clinical Impressions(s) / ED Diagnoses   Final diagnoses:  Acute cystitis with hematuria  Lower abdominal pain  Epigastric pain  BV (bacterial vaginosis)    ED Discharge Orders         Ordered    metroNIDAZOLE (FLAGYL) 500 MG tablet  2 times daily     04/04/19 2054    cephALEXin (KEFLEX) 500 MG capsule  3  times daily  04/04/19 2054    famotidine (PEPCID) 20 MG tablet  2 times daily     04/04/19 2054           Legrand RamsFord, Jerrie Schussler N, PA-C 04/04/19 2202    Virgina Norfolkuratolo, Adam, DO 04/04/19 2309

## 2019-04-04 NOTE — Discharge Instructions (Signed)
You have a urinary tract infection which is likely causing most of your lower abdominal pain.  Please take Keflex 3 times daily for the next 7 days.  You have a urine culture pending will be called if a different antibiotic would be more appropriate.  Your wet prep also shows evidence of bacterial vaginosis and given your discharge on exam, please take Flagyl for the next 7 days, do not drink alcohol while you are on this medication.  The upper abdominal pain you have been experiencing after eating is likely due to stomach inflammation or acid reflux.  Please start taking Pepcid twice daily before breakfast and bedtime.  Follow-up with your primary care doctor and/or OB/GYN.  Return to the emergency department if you have significantly worsened pain, fevers, flank pain, vomiting or any other new or concerning symptoms.

## 2019-04-04 NOTE — ED Triage Notes (Signed)
Pt c/o generalized abd pain x 4 days-pelvic pain and dysuria, hematuria x today-NAD-steady gait

## 2019-04-05 LAB — GC/CHLAMYDIA PROBE AMP (~~LOC~~) NOT AT ARMC
Chlamydia: NEGATIVE
Neisseria Gonorrhea: NEGATIVE

## 2019-04-06 LAB — HIV ANTIBODY (ROUTINE TESTING W REFLEX): HIV Screen 4th Generation wRfx: NONREACTIVE

## 2019-04-06 LAB — URINE CULTURE: Culture: 10000 — AB

## 2019-04-06 LAB — RPR: RPR Ser Ql: NONREACTIVE

## 2019-08-07 ENCOUNTER — Encounter (HOSPITAL_BASED_OUTPATIENT_CLINIC_OR_DEPARTMENT_OTHER): Payer: Self-pay

## 2019-08-07 ENCOUNTER — Emergency Department (HOSPITAL_BASED_OUTPATIENT_CLINIC_OR_DEPARTMENT_OTHER)
Admission: EM | Admit: 2019-08-07 | Discharge: 2019-08-07 | Disposition: A | Discharge status: Home / Self Care | Payer: Self-pay | Attending: Emergency Medicine | Admitting: Emergency Medicine

## 2019-08-07 ENCOUNTER — Other Ambulatory Visit: Payer: Self-pay

## 2019-08-07 DIAGNOSIS — I10 Essential (primary) hypertension: Secondary | ICD-10-CM | POA: Insufficient documentation

## 2019-08-07 DIAGNOSIS — Z79899 Other long term (current) drug therapy: Secondary | ICD-10-CM | POA: Insufficient documentation

## 2019-08-07 DIAGNOSIS — N390 Urinary tract infection, site not specified: Secondary | ICD-10-CM | POA: Insufficient documentation

## 2019-08-07 HISTORY — DX: Other specified bacterial agents as the cause of diseases classified elsewhere: N76.0

## 2019-08-07 HISTORY — DX: Other specified bacterial agents as the cause of diseases classified elsewhere: B96.89

## 2019-08-07 HISTORY — DX: Urinary tract infection, site not specified: N39.0

## 2019-08-07 LAB — URINALYSIS, ROUTINE W REFLEX MICROSCOPIC
Bilirubin Urine: NEGATIVE
Glucose, UA: NEGATIVE mg/dL
Ketones, ur: NEGATIVE mg/dL
Nitrite: NEGATIVE
Protein, ur: NEGATIVE mg/dL
Specific Gravity, Urine: 1.025 (ref 1.005–1.030)
pH: 7.5 (ref 5.0–8.0)

## 2019-08-07 LAB — URINALYSIS, MICROSCOPIC (REFLEX): WBC, UA: >50 WBC/hpf (ref 0–5)

## 2019-08-07 LAB — PREGNANCY, URINE: Preg Test, Ur: NEGATIVE

## 2019-08-07 MED ORDER — PHENAZOPYRIDINE HCL 200 MG PO TABS: 200.0000 mg | ORAL_TABLET | Freq: Three times a day (TID) | ORAL | 0 refills | Status: AC

## 2019-08-07 MED ORDER — CEPHALEXIN 500 MG PO CAPS: 500.0000 mg | ORAL_CAPSULE | Freq: Four times a day (QID) | ORAL | 0 refills | Status: AC

## 2019-08-07 MED ORDER — METRONIDAZOLE 500 MG PO TABS: 500.0000 mg | ORAL_TABLET | Freq: Two times a day (BID) | ORAL | 0 refills | Status: AC

## 2019-08-07 MED FILL — PHENAZOPYRIDINE 200 MG TAB: 200 | 2 days supply | Qty: 6 | Fill #0

## 2019-08-07 MED FILL — CEPHALEXIN 500 MG CAPSULE: 500 | 7 days supply | Qty: 28 | Fill #0

## 2019-08-07 MED FILL — METRONIDAZOLE 500 MG TABS: 500 | 7 days supply | Qty: 14 | Fill #0

## 2019-08-07 NOTE — ED Triage Notes (Signed)
Pt c/o dysuria and freq x 3-4 days-NAD-steady gait

## 2019-08-07 NOTE — Discharge Instructions (Signed)
You were seen in the emergency department for 4 days of urinary symptoms.  Your urinalysis showed signs of infection.  Your pregnancy test was negative.  We are treating you with an antibiotic and some medicine to help with the stinging when you urinate.  This will often turn your urine orange.  Drink plenty of fluids.  We are also covering you with an antibiotic for possible BV.  Follow-up with your doctor and return if any worsening symptoms.

## 2019-08-07 NOTE — ED Notes (Signed)
C/o urinary freq and burning w urination

## 2019-08-07 NOTE — ED Provider Notes (Signed)
[facility] EMERGENCY DEPARTMENT Provider Note   CSN: [id number] Arrival date & time: 08/07/19  1203     History   Chief Complaint Chief Complaint  Patient presents with  . Dysuria    HPI Renee Moore is a 33 y.o. female.  She is a history of hypertension and frequent urinary tract infections.  She is complaining of 4 days of urinary frequency and dysuria.  She feels she has another urinary tract infection and she is also feels like she probably has BV again.  Denies any chance of pregnancy, had her tubes tied.  No significant vaginal discharge.  No fevers or chills abdominal pain or back pain.     The history is provided by the patient.  Dysuria Pain quality:  Burning Pain severity:  Moderate Onset quality:  Sudden Progression:  Unchanged Chronicity:  Recurrent Relieved by:  None tried Worsened by:  Nothing Ineffective treatments:  None tried Urinary symptoms: frequent urination   Urinary symptoms: no hematuria and no bladder incontinence   Associated symptoms: no abdominal pain, no fever, no flank pain, no genital lesions, no nausea, no vaginal discharge and no vomiting   Risk factors: no kidney transplant     Past Medical History:  Diagnosis Date  . BV (bacterial vaginosis)   . Hypertension   . UTI (urinary tract infection)     There are no active problems to display for this patient.   Past Surgical History:  Procedure Laterality Date  . APPENDECTOMY    . BREAST CYST EXCISION    . TUBAL LIGATION       OB History   No obstetric history on file.      Home Medications    Prior to Admission medications   Medication Sig Start Date End Date Taking? Authorizing Provider  famotidine (PEPCID) 20 MG tablet Take 1 tablet (20 mg total) by mouth 2 (two) times daily. 04/04/19   Dartha Lodge, PA-C  ibuprofen (ADVIL,MOTRIN) 800 MG tablet Take 1 tablet (800 mg total) by mouth every 8 (eight) hours as needed for mild pain. 04/28/17   Ward, Layla Maw, DO   methocarbamol (ROBAXIN) 500 MG tablet Take 1 tablet (500 mg total) by mouth every 8 (eight) hours as needed for muscle spasms. 04/28/17   Ward, Layla Maw, DO  metroNIDAZOLE (FLAGYL) 500 MG tablet Take 1 tablet (500 mg total) by mouth 2 (two) times daily. One po bid x 7 days 04/04/19   Dartha Lodge, PA-C    Family History No family history on file.  Social History Social History   Tobacco Use  . Smoking status: Never Smoker  . Smokeless tobacco: Never Used  Substance Use Topics  . Alcohol use: Yes    Comment: weekends  . Drug use: No     Allergies   Patient has no known allergies.   Review of Systems Review of Systems  Constitutional: Negative for fever.  HENT: Negative for sore throat.   Eyes: Negative for visual disturbance.  Respiratory: Negative for shortness of breath.   Cardiovascular: Negative for chest pain.  Gastrointestinal: Negative for abdominal pain, nausea and vomiting.  Genitourinary: Positive for dysuria. Negative for flank pain and vaginal discharge.  Musculoskeletal: Negative for back pain.  Skin: Negative for rash.  Neurological: Negative for headaches.     Physical Exam Updated Vital Signs Pulse 80   Temp 99 F (37.2 C) (Oral)   Resp 16   Ht 5\' 5"  (1.651 m)   Wt )  149.7 kg   LMP 07/30/2019   SpO2 100%   BMI 54.91 kg/m   Physical Exam Vitals signs and nursing note reviewed.  Constitutional:      General: She is not in acute distress.    Appearance: She is well-developed.  HENT:     Head: Normocephalic and atraumatic.  Eyes:     Conjunctiva/sclera: Conjunctivae normal.  Neck:     Musculoskeletal: Neck supple.  Cardiovascular:     Rate and Rhythm: Normal rate and regular rhythm.     Heart sounds: No murmur.  Pulmonary:     Effort: Pulmonary effort is normal. No respiratory distress.     Breath sounds: Normal breath sounds.  Abdominal:     Palpations: Abdomen is soft.     Tenderness: There is no abdominal tenderness.  Skin:     General: Skin is warm and dry.  Neurological:     General: No focal deficit present.     Mental Status: She is alert.      ED Treatments / Results  Labs (all labs ordered are listed, but only abnormal results are displayed) Labs Reviewed  URINALYSIS, ROUTINE W REFLEX MICROSCOPIC - Abnormal; Notable for the following components:      Result Value   APPearance CLOUDY (*)    Hgb urine dipstick MODERATE (*)    Leukocytes,Ua SMALL (*)    All other components within normal limits  URINALYSIS, MICROSCOPIC (REFLEX) - Abnormal; Notable for the following components:   Bacteria, UA MANY (*)    All other components within normal limits  PREGNANCY, URINE    EKG None  Radiology No results found.  Procedures Procedures (including critical care time)  Medications Ordered in ED Medications - No data to display   Initial Impression / Assessment and Plan / ED Course  I have reviewed the triage vital signs and the nursing notes.  Pertinent labs & imaging results that were available during my care of the patient were reviewed by me and considered in my medical decision making (see chart for details).  Clinical Course as of Aug 07 1735  Mon Aug 06, 1412  [id number] 33 year old female here with 4 days of dysuria.  Differential includes UTI, BV, Pilo.  Urinalysis consistent with UTI.  She is also asking for treatment of BV as she often will get this also.   [MB]    Clinical Course User Index [MB] Hayden Rasmussen, MD        Final Clinical Impressions(s) / ED Diagnoses   Final diagnoses:  Lower urinary tract infectious disease    ED Discharge Orders         Ordered    metroNIDAZOLE (FLAGYL) 500 MG tablet  2 times daily     08/07/19 1322    cephALEXin (KEFLEX) 500 MG capsule  4 times daily     08/07/19 1322    phenazopyridine (PYRIDIUM) 200 MG tablet  3 times daily     08/07/19 1322           Hayden Rasmussen, MD 08/07/19 1737

## 2019-08-16 ENCOUNTER — Encounter (HOSPITAL_BASED_OUTPATIENT_CLINIC_OR_DEPARTMENT_OTHER): Payer: Self-pay | Admitting: *Deleted

## 2019-08-16 ENCOUNTER — Emergency Department (HOSPITAL_BASED_OUTPATIENT_CLINIC_OR_DEPARTMENT_OTHER): Payer: Self-pay

## 2019-08-16 ENCOUNTER — Other Ambulatory Visit: Payer: Self-pay

## 2019-08-16 ENCOUNTER — Emergency Department (HOSPITAL_BASED_OUTPATIENT_CLINIC_OR_DEPARTMENT_OTHER)
Admission: EM | Admit: 2019-08-16 | Discharge: 2019-08-16 | Disposition: A | Discharge status: Home / Self Care | Payer: Self-pay | Attending: Emergency Medicine | Admitting: Emergency Medicine

## 2019-08-16 DIAGNOSIS — Z20828 Contact with and (suspected) exposure to other viral communicable diseases: Secondary | ICD-10-CM | POA: Insufficient documentation

## 2019-08-16 DIAGNOSIS — I10 Essential (primary) hypertension: Secondary | ICD-10-CM | POA: Insufficient documentation

## 2019-08-16 DIAGNOSIS — Z79899 Other long term (current) drug therapy: Secondary | ICD-10-CM | POA: Insufficient documentation

## 2019-08-16 DIAGNOSIS — J069 Acute upper respiratory infection, unspecified: Secondary | ICD-10-CM

## 2019-08-16 MED ORDER — ACETAMINOPHEN 325 MG PO TABS
650.0000 mg | ORAL_TABLET | Freq: Once | ORAL | Status: AC
  Administered 2019-08-16: 650 mg via ORAL
  Filled 2019-08-16: qty 2

## 2019-08-16 NOTE — ED Triage Notes (Signed)
Pt c/o cough, bodyaches , h/a x 3 days, exposed to family with covid

## 2019-08-16 NOTE — ED Provider Notes (Signed)
[facility] EMERGENCY DEPARTMENT Provider Note   CSN: [id number] Arrival date & time: 08/16/19  1948     History   Chief Complaint Chief Complaint  Patient presents with  . Cough    HPI Renee Moore is a 33 y.o. female.     Patient with a complaint of of cough congestion.  Denies any significant body aches mild headache mostly sinus congestion.  Exposed to a family member with Covid.  Denies any fevers nausea vomiting or diarrhea.  No shortness of breath.  In addition patient has been coughing a lot in the past 24 hours and she has some chest wall soreness from that.     Past Medical History:  Diagnosis Date  . BV (bacterial vaginosis)   . Hypertension   . UTI (urinary tract infection)     There are no active problems to display for this patient.   Past Surgical History:  Procedure Laterality Date  . APPENDECTOMY    . BREAST CYST EXCISION    . TUBAL LIGATION       OB History   No obstetric history on file.      Home Medications    Prior to Admission medications   Medication Sig Start Date End Date Taking? Authorizing Provider  cephALEXin (KEFLEX) 500 MG capsule Take 1 capsule (500 mg total) by mouth 4 (four) times daily. 08/07/19   Terrilee Files, MD  famotidine (PEPCID) 20 MG tablet Take 1 tablet (20 mg total) by mouth 2 (two) times daily. 04/04/19   Dartha Lodge, PA-C  ibuprofen (ADVIL,MOTRIN) 800 MG tablet Take 1 tablet (800 mg total) by mouth every 8 (eight) hours as needed for mild pain. 04/28/17   Ward, Layla Maw, DO  methocarbamol (ROBAXIN) 500 MG tablet Take 1 tablet (500 mg total) by mouth every 8 (eight) hours as needed for muscle spasms. 04/28/17   Ward, Layla Maw, DO  metroNIDAZOLE (FLAGYL) 500 MG tablet Take 1 tablet (500 mg total) by mouth 2 (two) times daily. One po bid x 7 days 08/07/19   Terrilee Files, MD  phenazopyridine (PYRIDIUM) 200 MG tablet Take 1 tablet (200 mg total) by mouth 3 (three) times daily. 08/07/19   Terrilee Files, MD    Family History History reviewed. No pertinent family history.  Social History Social History   Tobacco Use  . Smoking status: Never Smoker  . Smokeless tobacco: Never Used  Substance Use Topics  . Alcohol use: Yes    Comment: weekends  . Drug use: No     Allergies   Patient has no known allergies.   Review of Systems Review of Systems  Constitutional: Negative for chills and fever.  HENT: Positive for congestion and sinus pressure. Negative for rhinorrhea and sore throat.   Eyes: Negative for visual disturbance.  Respiratory: Positive for cough. Negative for shortness of breath.   Cardiovascular: Negative for chest pain and leg swelling.  Gastrointestinal: Negative for abdominal pain, diarrhea, nausea and vomiting.  Genitourinary: Negative for dysuria.  Musculoskeletal: Negative for back pain, myalgias and neck pain.  Skin: Negative for rash.  Neurological: Positive for headaches. Negative for dizziness and light-headedness.  Hematological: Does not bruise/bleed easily.  Psychiatric/Behavioral: Negative for confusion.     Physical Exam Updated Vital Signs BP (!) 146/126   Pulse 91   Temp 98.9 F (37.2 C)   Resp 18   Ht 1.651 m (5\' 5" )   Wt (!) 149 kg   LMP  07/30/2019   SpO2 100%   BMI 54.66 kg/m   Physical Exam Vitals signs and nursing note reviewed.  Constitutional:      General: She is not in acute distress.    Appearance: Normal appearance. She is well-developed.  HENT:     Head: Normocephalic and atraumatic.  Eyes:     Extraocular Movements: Extraocular movements intact.     Conjunctiva/sclera: Conjunctivae normal.     Pupils: Pupils are equal, round, and reactive to light.  Neck:     Musculoskeletal: Normal range of motion and neck supple.  Cardiovascular:     Rate and Rhythm: Normal rate and regular rhythm.     Heart sounds: No murmur.  Pulmonary:     Effort: Pulmonary effort is normal. No respiratory distress.     Breath  sounds: Normal breath sounds. No wheezing.  Chest:     Chest wall: No tenderness.  Abdominal:     Palpations: Abdomen is soft.     Tenderness: There is no abdominal tenderness.  Musculoskeletal: Normal range of motion.  Skin:    General: Skin is warm and dry.     Capillary Refill: Capillary refill takes less than 2 seconds.  Neurological:     General: No focal deficit present.     Mental Status: She is alert and oriented to person, place, and time.      ED Treatments / Results  Labs (all labs ordered are listed, but only abnormal results are displayed) Labs Reviewed  SARS CORONAVIRUS 2 (TAT 6-24 HRS)    EKG None  Radiology Dg Chest Port 1 View  Result Date: 08/16/2019 CLINICAL DATA:  Cough, COVID-19 exposure EXAM: PORTABLE CHEST 1 VIEW COMPARISON:  Radiograph 11/06/2018 FINDINGS: Gradient density towards the lung bases is likely related to patient body habitus and the portable technique. No consolidation, features of edema, pneumothorax, or effusion. The cardiomediastinal contours are unremarkable. No acute osseous or soft tissue abnormality. IMPRESSION: Accounting for body habitus, the lungs are clear. Electronically Signed   By: Lovena Le M.D.   On: 08/16/2019 21:48    Procedures Procedures (including critical care time)  Medications Ordered in ED Medications  acetaminophen (TYLENOL) tablet 650 mg (650 mg Oral Given 08/16/19 2104)     Initial Impression / Assessment and Plan / ED Course  I have reviewed the triage vital signs and the nursing notes.  Pertinent labs & imaging results that were available during my care of the patient were reviewed by me and considered in my medical decision making (see chart for details).       Chest x-ray negative.  Symptoms most likely more consistent with a upper respiratory viral infection and not COVID-19 infection.  However patient had Covid testing done here tonight.  Patient will be taken out of work until the results are  back.  Patient will take Mucinex DM for the cough.   Final Clinical Impressions(s) / ED Diagnoses   Final diagnoses:  Viral URI with cough    ED Discharge Orders    None       Fredia Sorrow, MD 08/16/19 2208

## 2019-08-16 NOTE — Discharge Instructions (Signed)
Symptoms most likely consistent with a viral upper respiratory infection and not COVID-19 infection.  However testing for COVID-19 was done.  You should have your results in 1 to 2 days.  Work note provided to be out of work for 2 days while results are pending.  In the meantime recommend using Mucinex DM for the cough and congestion.  Return for development of fever shortness of breath or any new or worse symptoms.

## 2019-08-17 LAB — SARS CORONAVIRUS 2 (TAT 6-24 HRS): SARS Coronavirus 2: NEGATIVE

## 2020-09-27 IMAGING — DX DG CHEST 1V PORT
1 series · 1 of 1 positions shown · non-contrast
Comparison: Radiograph 11/06/2018

CLINICAL DATA: Cough, [id number] exposure

EXAM:
PORTABLE CHEST 1 VIEW

[chest ap]
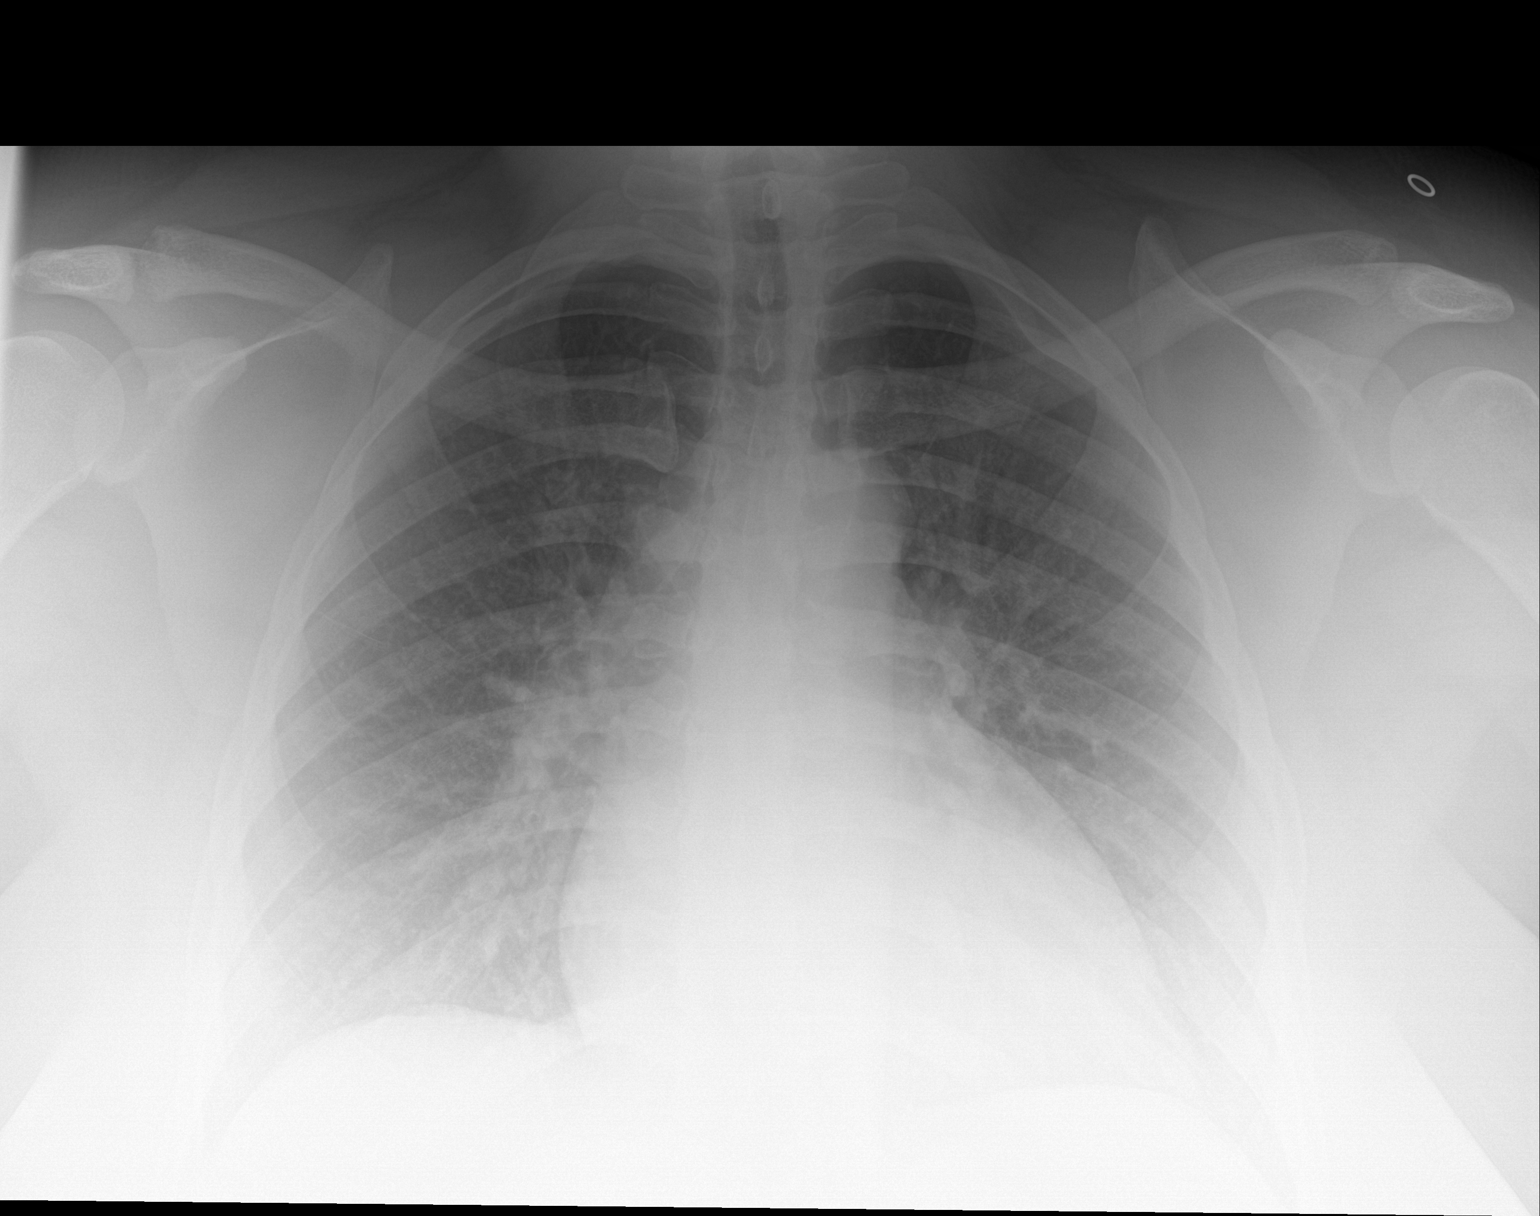

[1 of 1 positions shown; findings below may reference images not displayed]

FINDINGS: Gradient density towards the lung bases is likely related to patient
body habitus and the portable technique. No consolidation, features
of edema, pneumothorax, or effusion. The cardiomediastinal contours
are unremarkable. No acute osseous or soft tissue abnormality.
IMPRESSION: Accounting for body habitus, the lungs are clear.
